# Patient Record
Sex: Female | Born: 1955 | Race: White | Hispanic: No | Marital: Married | Smoking: Never smoker
Health system: Southern US, Community
[De-identification: ages and names within clinical notes are randomized; demographics above are authoritative.]

## PROBLEM LIST (undated history)

## (undated) DIAGNOSIS — G4730 Sleep apnea, unspecified: Secondary | ICD-10-CM

## (undated) DIAGNOSIS — T7840XA Allergy, unspecified, initial encounter: Secondary | ICD-10-CM

## (undated) DIAGNOSIS — Z8719 Personal history of other diseases of the digestive system: Secondary | ICD-10-CM

## (undated) DIAGNOSIS — M8580 Other specified disorders of bone density and structure, unspecified site: Secondary | ICD-10-CM

## (undated) DIAGNOSIS — R569 Unspecified convulsions: Secondary | ICD-10-CM

## (undated) DIAGNOSIS — M1990 Unspecified osteoarthritis, unspecified site: Secondary | ICD-10-CM

## (undated) DIAGNOSIS — R519 Headache, unspecified: Secondary | ICD-10-CM

## (undated) HISTORY — DX: Allergy, unspecified, initial encounter: T78.40XA

## (undated) HISTORY — PX: TONSILLECTOMY AND ADENOIDECTOMY: SUR1326

## (undated) HISTORY — DX: Unspecified convulsions: R56.9

## (undated) HISTORY — PX: BUNIONECTOMY: SHX129

## (undated) HISTORY — PX: DILATION AND CURETTAGE OF UTERUS: SHX78

## (undated) HISTORY — DX: Other specified disorders of bone density and structure, unspecified site: M85.80

## (undated) HISTORY — PX: TUBAL LIGATION: SHX77

## (undated) HISTORY — DX: Sleep apnea, unspecified: G47.30

---

## 1999-01-04 ENCOUNTER — Other Ambulatory Visit: Admission: RE | Admit: 1999-01-04 | Discharge: 1999-01-04 | Payer: Self-pay | Admitting: Gynecology

## 2000-01-30 ENCOUNTER — Other Ambulatory Visit: Admission: RE | Admit: 2000-01-30 | Discharge: 2000-01-30 | Payer: Self-pay | Admitting: Gynecology

## 2001-02-07 ENCOUNTER — Other Ambulatory Visit: Admission: RE | Admit: 2001-02-07 | Discharge: 2001-02-07 | Payer: Self-pay | Admitting: Gynecology

## 2002-03-31 ENCOUNTER — Other Ambulatory Visit: Admission: RE | Admit: 2002-03-31 | Discharge: 2002-03-31 | Payer: Self-pay | Admitting: Gynecology

## 2002-09-16 ENCOUNTER — Other Ambulatory Visit: Admission: RE | Admit: 2002-09-16 | Discharge: 2002-09-16 | Payer: Self-pay | Admitting: Gynecology

## 2003-04-02 ENCOUNTER — Other Ambulatory Visit: Admission: RE | Admit: 2003-04-02 | Discharge: 2003-04-02 | Payer: Self-pay | Admitting: Gynecology

## 2004-02-26 ENCOUNTER — Encounter (INDEPENDENT_AMBULATORY_CARE_PROVIDER_SITE_OTHER): Payer: Self-pay | Admitting: *Deleted

## 2004-02-26 ENCOUNTER — Ambulatory Visit (HOSPITAL_COMMUNITY): Admission: RE | Admit: 2004-02-26 | Discharge: 2004-02-26 | Payer: Self-pay | Admitting: Gynecology

## 2004-05-24 ENCOUNTER — Other Ambulatory Visit: Admission: RE | Admit: 2004-05-24 | Discharge: 2004-05-24 | Payer: Self-pay | Admitting: Gynecology

## 2004-10-07 ENCOUNTER — Ambulatory Visit: Payer: Self-pay | Admitting: Family Medicine

## 2005-01-12 ENCOUNTER — Ambulatory Visit: Payer: Self-pay | Admitting: Family Medicine

## 2005-02-10 ENCOUNTER — Ambulatory Visit: Payer: Self-pay | Admitting: Family Medicine

## 2005-05-25 ENCOUNTER — Other Ambulatory Visit: Admission: RE | Admit: 2005-05-25 | Discharge: 2005-05-25 | Payer: Self-pay | Admitting: Gynecology

## 2005-05-26 ENCOUNTER — Ambulatory Visit: Payer: Self-pay | Admitting: Family Medicine

## 2005-06-16 ENCOUNTER — Ambulatory Visit: Payer: Self-pay | Admitting: Family Medicine

## 2005-07-20 ENCOUNTER — Ambulatory Visit: Payer: Self-pay | Admitting: Family Medicine

## 2005-09-21 ENCOUNTER — Ambulatory Visit: Payer: Self-pay | Admitting: Family Medicine

## 2008-09-09 ENCOUNTER — Ambulatory Visit (HOSPITAL_BASED_OUTPATIENT_CLINIC_OR_DEPARTMENT_OTHER): Admission: RE | Admit: 2008-09-09 | Discharge: 2008-09-09 | Payer: Self-pay | Admitting: Gynecology

## 2008-09-09 ENCOUNTER — Encounter (INDEPENDENT_AMBULATORY_CARE_PROVIDER_SITE_OTHER): Payer: Self-pay | Admitting: Gynecology

## 2010-10-20 LAB — POCT HEMOGLOBIN-HEMACUE: Hemoglobin: 13.8 g/dL (ref 12.0–15.0)

## 2010-11-22 NOTE — Op Note (Signed)
NAMEANJULI, Joyce Allen              ACCOUNT NO.:  [id number]   MEDICAL RECORD NO.:  [id number]          PATIENT TYPE:  AMB   LOCATION:  [facility]                         FACILITY:  [facility]   PHYSICIAN:  Luvenia Redden, M.D.   DATE OF BIRTH:  [birth year]   DATE OF PROCEDURE:  09/09/2008  DATE OF DISCHARGE:                               OPERATIVE REPORT   PREOPERATIVE DIAGNOSES:  1. Postmenopausal bleeding.  2. Cervical stenosis.   POSTOPERATIVE DIAGNOSES:  1. Postmenopausal bleeding.  2. Cervical stenosis.   OPERATION:  Hysteroscopy and dilation and curettage.   SURGEON:  Luvenia Redden, M.D.   PROCEDURE:  Under good anesthesia, the patient prepped and draped in a  sterile manner.  Bimanual exam revealed the uterus to be anterior,  normal sized.  There were no adnexal masses or pelvic masses palpable.  The cervix was grasped with a tenaculum and the cervix was sounded with  the smallest dilator, after some difficulty, the internal os was located  and entered.  Following this, the uterus sounded to a depth of 3 inches.  The cervix was dilated easily.  Then the cervical curettage was done.  Curettage was done and this was sent as a separate specimen.  Hysteroscope was inserted into the cervical canal.  Using sorbitol as  the distending medium, the scope was advanced and the cavity viewed.  There were several small polypoid structures seen and no other  abnormalities.  Scope was retracted.  The endometrial cavity was  explored with polyp forceps and several fragments of polypoid tissue  were removed.  The cavity was then curetted completely and sharply with  a Haney curette.  Again, a small amount of tissue was obtained.  The  endometrial cavity was then wiped with a dry sponge.  No loose fragments  were obtained.  The patient was observed for a few minutes for excessive  bleeding.  There was none.  Fluid deficit was 20 mL.  Blood loss was  minimal.  The patient tolerated the procedure  well.  She was removed to  recovery in good condition.           ______________________________  Luvenia Redden, M.D.     WSB/MEDQ  D:  09/09/2008  T:  09/09/2008  Job:  [id number]

## 2010-11-25 NOTE — Op Note (Signed)
NAME:  Joyce Allen, Joyce Allen                        ACCOUNT NO.:  [id number]   MEDICAL RECORD NO.:  [id number]                   PATIENT TYPE:  AMB   LOCATION:  [facility]                                  FACILITY:  [facility]   PHYSICIAN:  Luvenia Redden, M.D.                DATE OF BIRTH:  11/26/1955   DATE OF PROCEDURE:  02/26/2004  DATE OF DISCHARGE:                                 OPERATIVE REPORT   PREOPERATIVE DIAGNOSES:  Abnormal uterine bleeding.   POSTOPERATIVE DIAGNOSES:  1. Abnormal uterine bleeding.  2. Probable endometrial polyp.   SURGEON:  Luvenia Redden, M.D.   DESCRIPTION OF PROCEDURE:  Under good sedation, the patient was prepped and  draped in a sterile manner. Manual exam revealed the uterus to be mid  position, normal size, movable and no adnexal masses were palpable. The  cervix was grasped with a tenaculum, paracervical block performed by  injecting 10 mL of 1% lidocaine at the 4 and 8 o'clock position  paracervically. The attempts to sound the uterus were unsuccessful due to  cervical stenosis. The smallest uterine dilator was used to gently probe the  canal, canal was found and then was able to be dilated adequately.  An  endocervical curettage was done and this was sent as a separate specimen.  Endometrial curettage was done and a moderate amount of tissue was obtained  including a fairly large fragment which had the appearance of being a polyp.  The endometrial cavity was then wiped with a dry sponge, procedure was  terminated. Blood loss 30 mL, none was replaced.  The patient tolerated the  procedure well and was removed to the recovery room in good condition.                                               Luvenia Redden, M.D.    WSB/MEDQ  D:  02/26/2004  T:  02/27/2004  Job:  [id number]

## 2011-06-26 ENCOUNTER — Other Ambulatory Visit: Payer: Self-pay | Admitting: Gynecology

## 2013-06-30 ENCOUNTER — Other Ambulatory Visit: Payer: Self-pay | Admitting: Gynecology

## 2014-07-07 ENCOUNTER — Other Ambulatory Visit: Payer: Self-pay | Admitting: Obstetrics and Gynecology

## 2014-07-08 LAB — CYTOLOGY - PAP

## 2015-09-27 DIAGNOSIS — Z79899 Other long term (current) drug therapy: Secondary | ICD-10-CM | POA: Insufficient documentation

## 2015-09-27 DIAGNOSIS — G4730 Sleep apnea, unspecified: Secondary | ICD-10-CM | POA: Insufficient documentation

## 2015-09-27 DIAGNOSIS — M8589 Other specified disorders of bone density and structure, multiple sites: Secondary | ICD-10-CM | POA: Insufficient documentation

## 2015-09-27 DIAGNOSIS — F5101 Primary insomnia: Secondary | ICD-10-CM | POA: Insufficient documentation

## 2015-09-27 DIAGNOSIS — G40909 Epilepsy, unspecified, not intractable, without status epilepticus: Secondary | ICD-10-CM | POA: Insufficient documentation

## 2015-09-27 DIAGNOSIS — E782 Mixed hyperlipidemia: Secondary | ICD-10-CM | POA: Insufficient documentation

## 2016-08-11 ENCOUNTER — Other Ambulatory Visit: Payer: Self-pay | Admitting: Obstetrics and Gynecology

## 2016-08-14 LAB — CYTOLOGY - PAP

## 2016-08-16 ENCOUNTER — Other Ambulatory Visit: Payer: Self-pay | Admitting: Obstetrics and Gynecology

## 2016-08-16 DIAGNOSIS — R928 Other abnormal and inconclusive findings on diagnostic imaging of breast: Secondary | ICD-10-CM

## 2016-08-18 ENCOUNTER — Ambulatory Visit
Admission: RE | Admit: 2016-08-18 | Discharge: 2016-08-18 | Disposition: A | Discharge status: Home / Self Care | Payer: Managed Care, Other (non HMO) | Source: Ambulatory Visit | Attending: Obstetrics and Gynecology | Admitting: Obstetrics and Gynecology

## 2016-08-18 DIAGNOSIS — R928 Other abnormal and inconclusive findings on diagnostic imaging of breast: Secondary | ICD-10-CM

## 2016-08-21 ENCOUNTER — Other Ambulatory Visit: Payer: Self-pay

## 2016-10-18 DIAGNOSIS — R5381 Other malaise: Secondary | ICD-10-CM | POA: Insufficient documentation

## 2017-10-01 ENCOUNTER — Encounter: Payer: Self-pay | Admitting: Gastroenterology

## 2017-10-04 ENCOUNTER — Encounter: Payer: Self-pay | Admitting: Gastroenterology

## 2017-10-25 ENCOUNTER — Ambulatory Visit (AMBULATORY_SURGERY_CENTER): Payer: Self-pay

## 2017-10-25 ENCOUNTER — Other Ambulatory Visit: Payer: Self-pay

## 2017-10-25 VITALS — Ht 61.2 in | Wt 142.6 lb

## 2017-10-25 DIAGNOSIS — Z86010 Personal history of colonic polyps: Secondary | ICD-10-CM

## 2017-10-25 MED ORDER — SOD PICOSULFATE-MAG OX-CIT ACD 10-3.5-12 MG-GM -GM/160ML PO SOLN: 1.0000 | Freq: Once | ORAL | 0 refills | Status: AC

## 2017-10-25 NOTE — Progress Notes (Signed)
Denies allergies to eggs or soy products. Denies complication of anesthesia or sedation. Denies use of weight loss medication. Denies use of O2.   Emmi instructions declined.  

## 2017-10-29 ENCOUNTER — Encounter: Payer: Self-pay | Admitting: Gastroenterology

## 2017-11-08 ENCOUNTER — Encounter: Payer: Self-pay | Admitting: Gastroenterology

## 2017-11-08 ENCOUNTER — Ambulatory Visit (AMBULATORY_SURGERY_CENTER): Payer: Managed Care, Other (non HMO) | Admitting: Gastroenterology

## 2017-11-08 ENCOUNTER — Other Ambulatory Visit: Payer: Self-pay

## 2017-11-08 VITALS — BP 106/70 | HR 68 | Temp 98.0°F | Resp 13 | Ht 61.2 in | Wt 142.0 lb

## 2017-11-08 DIAGNOSIS — Z860100 Personal history of colon polyps, unspecified: Secondary | ICD-10-CM

## 2017-11-08 DIAGNOSIS — D124 Benign neoplasm of descending colon: Secondary | ICD-10-CM

## 2017-11-08 MED ORDER — SODIUM CHLORIDE 0.9 % IV SOLN: 500.0000 mL | Freq: Once | INTRAVENOUS | Status: DC

## 2017-11-08 NOTE — Progress Notes (Signed)
A/ox3 pleased with MAC, report to Tracy RN 

## 2017-11-08 NOTE — Progress Notes (Signed)
Called to room to assist during endoscopic procedure.  Patient ID and intended procedure confirmed with present staff. Received instructions for my participation in the procedure from the performing physician.  

## 2017-11-08 NOTE — Patient Instructions (Signed)
Impression/recommnendations:  Polyps (handout given) Diverticulosis (handout given) Hemorrhoids (handout given)  YOU HAD AN ENDOSCOPIC PROCEDURE TODAY AT [place]:   Refer to the procedure report that was given to you for any specific questions about what was found during the examination.  If the procedure report does not answer your questions, please call your gastroenterologist to clarify.  If you requested that your care partner not be given the details of your procedure findings, then the procedure report has been included in a sealed envelope for you to review at your convenience later.  YOU SHOULD EXPECT: Some feelings of bloating in the abdomen. Passage of more gas than usual.  Walking can help get rid of the air that was put into your GI tract during the procedure and reduce the bloating. If you had a lower endoscopy (such as a colonoscopy or flexible sigmoidoscopy) you may notice spotting of blood in your stool or on the toilet paper. If you underwent a bowel prep for your procedure, you may not have a normal bowel movement for a few days.  Please Note:  You might notice some irritation and congestion in your nose or some drainage.  This is from the oxygen used during your procedure.  There is no need for concern and it should clear up in a day or so.  SYMPTOMS TO REPORT IMMEDIATELY:   Following lower endoscopy (colonoscopy or flexible sigmoidoscopy):  Excessive amounts of blood in the stool  Significant tenderness or worsening of abdominal pains  Swelling of the abdomen that is new, acute  Fever of 100F or higher  For urgent or emergent issues, a gastroenterologist can be reached at any hour by calling [phone].   DIET:  We do recommend a small meal at first, but then you may proceed to your regular diet.  Drink plenty of fluids but you should avoid alcoholic beverages for 24 hours.  ACTIVITY:  You should plan to take it easy for the rest of today and  you should NOT DRIVE or use heavy machinery until tomorrow (because of the sedation medicines used during the test).    FOLLOW UP: Our staff will call the number listed on your records the next business day following your procedure to check on you and address any questions or concerns that you may have regarding the information given to you following your procedure. If we do not reach you, we will leave a message.  However, if you are feeling well and you are not experiencing any problems, there is no need to return our call.  We will assume that you have returned to your regular daily activities without incident.  If any biopsies were taken you will be contacted by phone or by letter within the next 1-3 weeks.  Please call us at [phone] if you have not heard about the biopsies in 3 weeks.    SIGNATURES/CONFIDENTIALITY: You and/or your care partner have signed paperwork which will be entered into your electronic medical record.  These signatures attest to the fact that that the information above on your After Visit Summary has been reviewed and is understood.  Full responsibility of the confidentiality of this discharge information lies with you and/or your care-partner.

## 2017-11-08 NOTE — Op Note (Signed)
[facility] Patient Name: Joyce Allen Procedure Date: 11/08/2017 9:43 AM MRN: [id number] Endoscopist: Jackquline Denmark MD, MD Age: 62 Referring MD:  Date of Birth: [birth year] Gender: Female Account #: [id number] Procedure:                Colonoscopy Indications:              High risk colon cancer surveillance: Personal                            history of colonic polyps Medicines:                Monitored Anesthesia Care Procedure:                Pre-Anesthesia Assessment:                           - Prior to the procedure, a History and Physical                            was performed, and patient medications and                            allergies were reviewed. The patient is competent.                            The risks and benefits of the procedure and the                            sedation options and risks were discussed with the                            patient. All questions were answered and informed                            consent was obtained. Patient identification and                            proposed procedure were verified by the physician                            in the procedure room. Mental Status Examination:                            alert and oriented. Prophylactic Antibiotics: The                            patient does not require prophylactic antibiotics.                            Prior Anticoagulants: The patient has taken no                            previous anticoagulant or antiplatelet agents. ASA  Grade Assessment: II - A patient with mild systemic                            disease. After reviewing the risks and benefits,                            the patient was deemed in satisfactory condition to                            undergo the procedure. The anesthesia plan was to                            use monitored anesthesia care (MAC). Immediately                            prior to administration  of medications, the patient                            was re-assessed for adequacy to receive sedatives.                            The heart rate, respiratory rate, oxygen                            saturations, blood pressure, adequacy of pulmonary                            ventilation, and response to care were monitored                            throughout the procedure. The physical status of                            the patient was re-assessed after the procedure.                           After obtaining informed consent, the colonoscope                            was passed under direct vision. Throughout the                            procedure, the patient's blood pressure, pulse, and                            oxygen saturations were monitored continuously. The                            Colonoscope was introduced through the anus and                            advanced to the 2 cm into the ileum. The  colonoscopy was performed without difficulty. The                            patient tolerated the procedure well. The quality                            of the bowel preparation was excellent. Scope In: 9:49:57 AM Scope Out: 10:06:57 AM Scope Withdrawal Time: 0 hours 10 minutes 8 seconds  Total Procedure Duration: 0 hours 17 minutes 0 seconds  Findings:                 A 4 mm polyp was found in the descending colon. The                            polyp was sessile. The polyp was removed with a                            cold biopsy forceps. Resection and retrieval were                            complete.                           A few small-mouthed diverticula were found in the                            sigmoid colon.                           Non-bleeding internal hemorrhoids were found. The                            hemorrhoids were small. Estimated blood loss: none. Complications:            No immediate complications. Estimated Blood Loss:      Estimated blood loss: none. Impression:               - Small descending colon polyp s/p polypectomy                           - Minimal sigmoid diverticulosis.                           - Otherwise normal colonoscopy. Recommendation:           - Patient has a contact number available for                            emergencies. The signs and symptoms of potential                            delayed complications were discussed with the                            patient. Return to normal activities tomorrow.  Written discharge instructions were provided to the                            patient.                           - Resume previous diet.                           - Continue present medications.                           - Await pathology results.                           - Repeat colonoscopy for surveillance based on                            pathology results.                           - Return to GI clinic PRN. Jackquline Denmark MD, MD 11/08/2017 10:12:07 AM This report has been signed electronically.

## 2017-11-08 NOTE — Progress Notes (Signed)
Pt's states no medical or surgical changes since previsit or office visit. 

## 2017-11-09 ENCOUNTER — Telehealth: Payer: Self-pay

## 2017-11-09 NOTE — Telephone Encounter (Signed)
  Follow up Call-  Call back number 11/08/2017  Post procedure Call Back phone  # [phone]  Permission to leave phone message Yes  Some recent data might be hidden     Patient questions:  Do you have a fever, pain , or abdominal swelling? No. Pain Score  0 *  Have you tolerated food without any problems? Yes.    Have you been able to return to your normal activities? Yes.    Do you have any questions about your discharge instructions: Diet   No. Medications  No. Follow up visit  No.  Do you have questions or concerns about your Care? No.  Actions: * If pain score is 4 or above: No action needed, pain <4.

## 2017-11-14 ENCOUNTER — Encounter: Payer: Self-pay | Admitting: Gastroenterology

## 2018-10-08 IMAGING — MG 2D DIGITAL DIAGNOSTIC UNILATERAL LEFT MAMMOGRAM WITH CAD AND ADJ
6 series · 6 of 14 positions shown · non-contrast
Comparison: Previous exam(s).

CLINICAL DATA: Screening recall for possible left breast asymmetry.

EXAM:
2D DIGITAL DIAGNOSTIC UNILATERAL LEFT MAMMOGRAM WITH CAD AND ADJUNCT
TOMO
LEFT BREAST ULTRASOUND

[L MLO synth-2D]
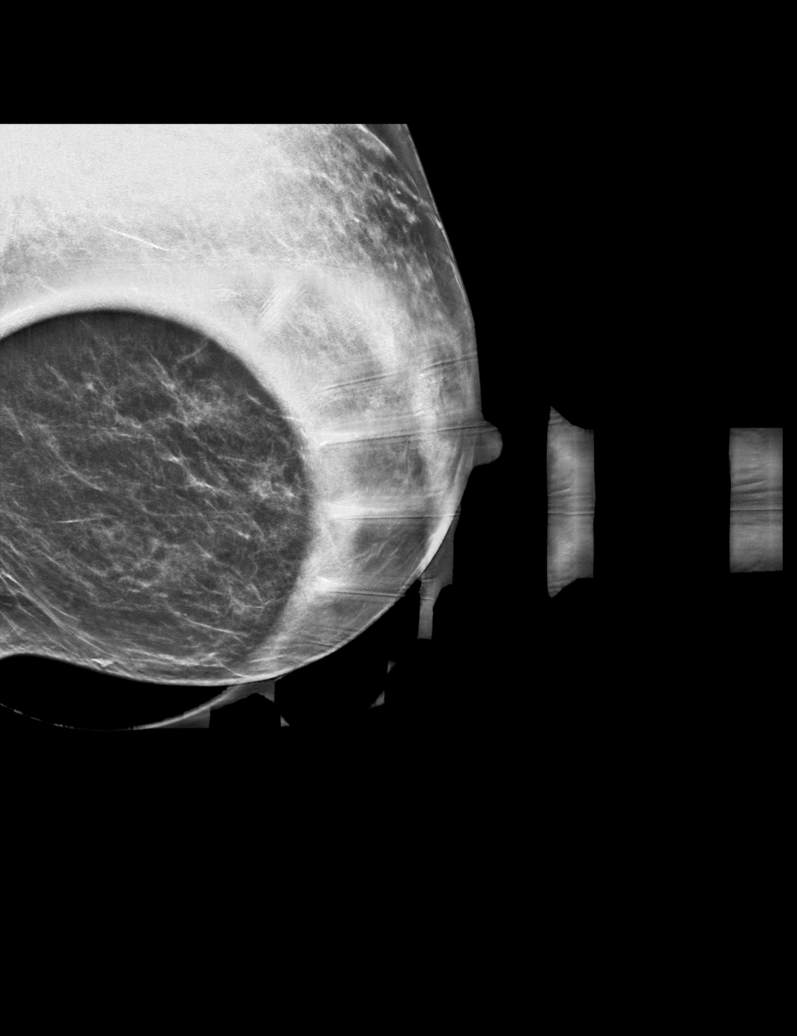

[L ML]
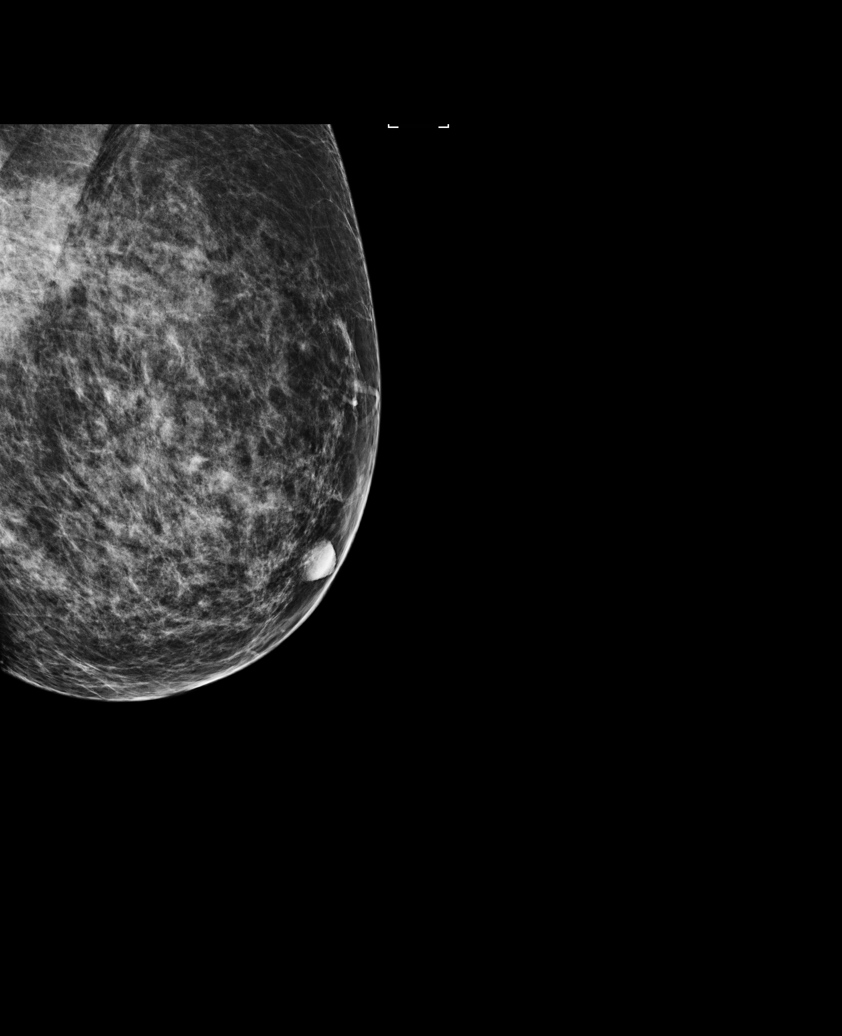

[L MLO]
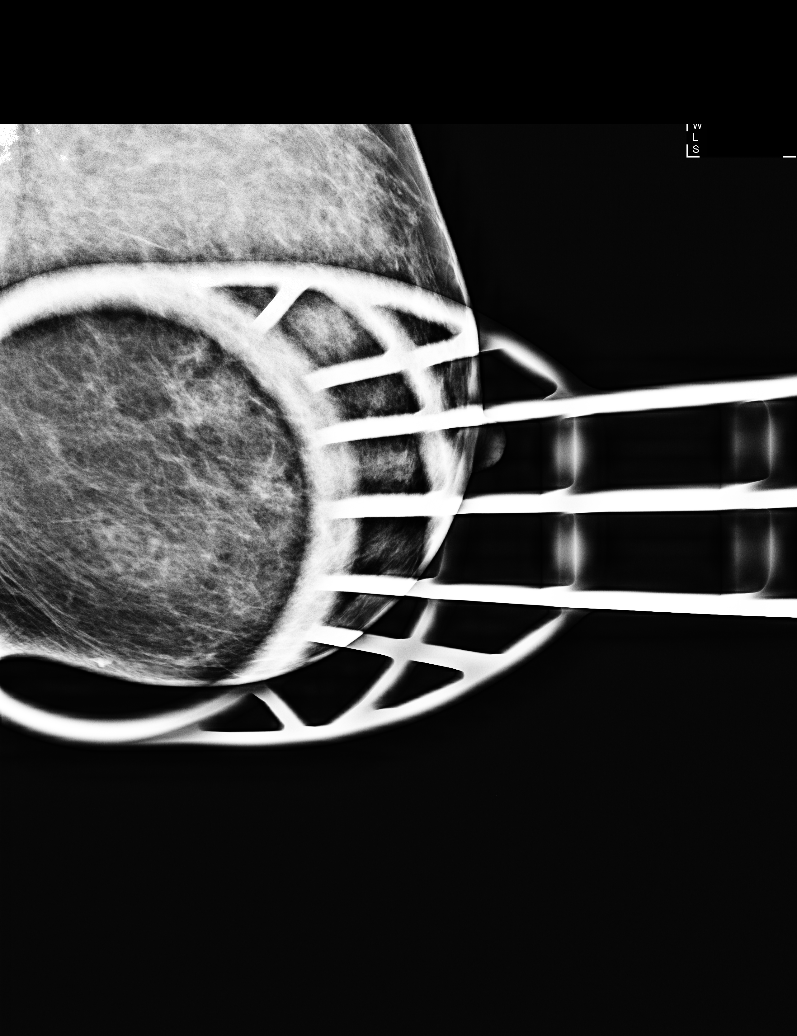

[L ML synth-2D]
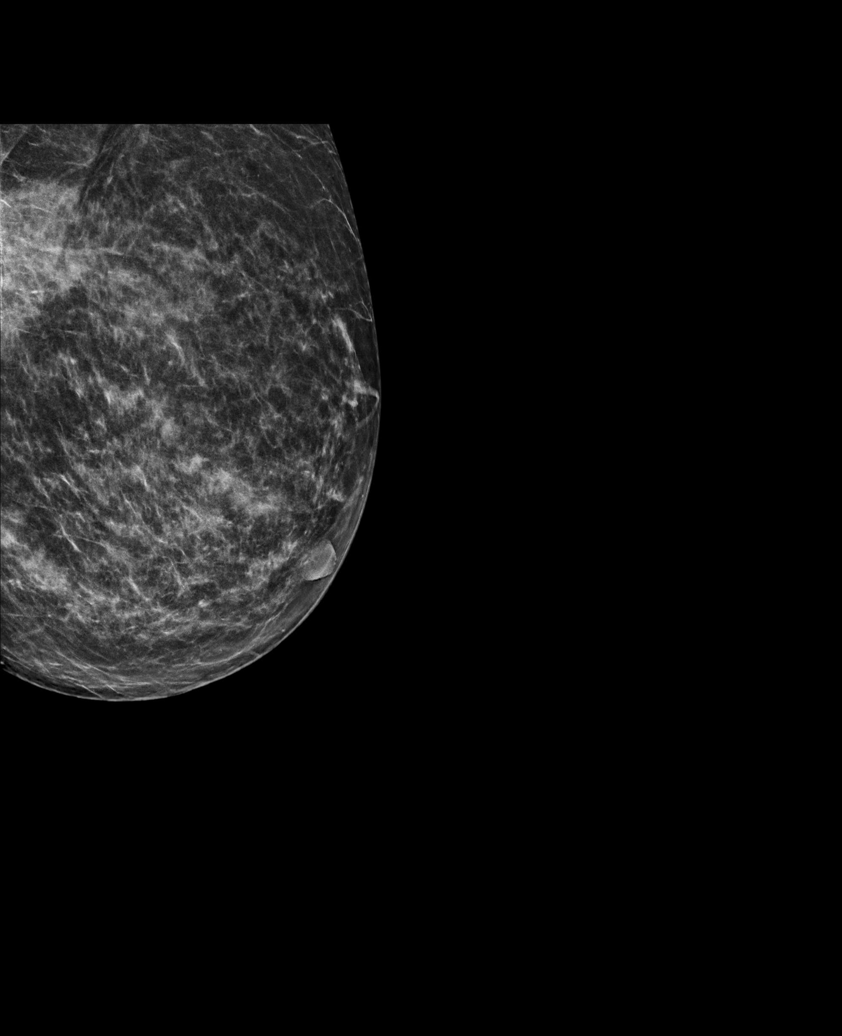

[L ML tomo · tomo slice 33/64.0]
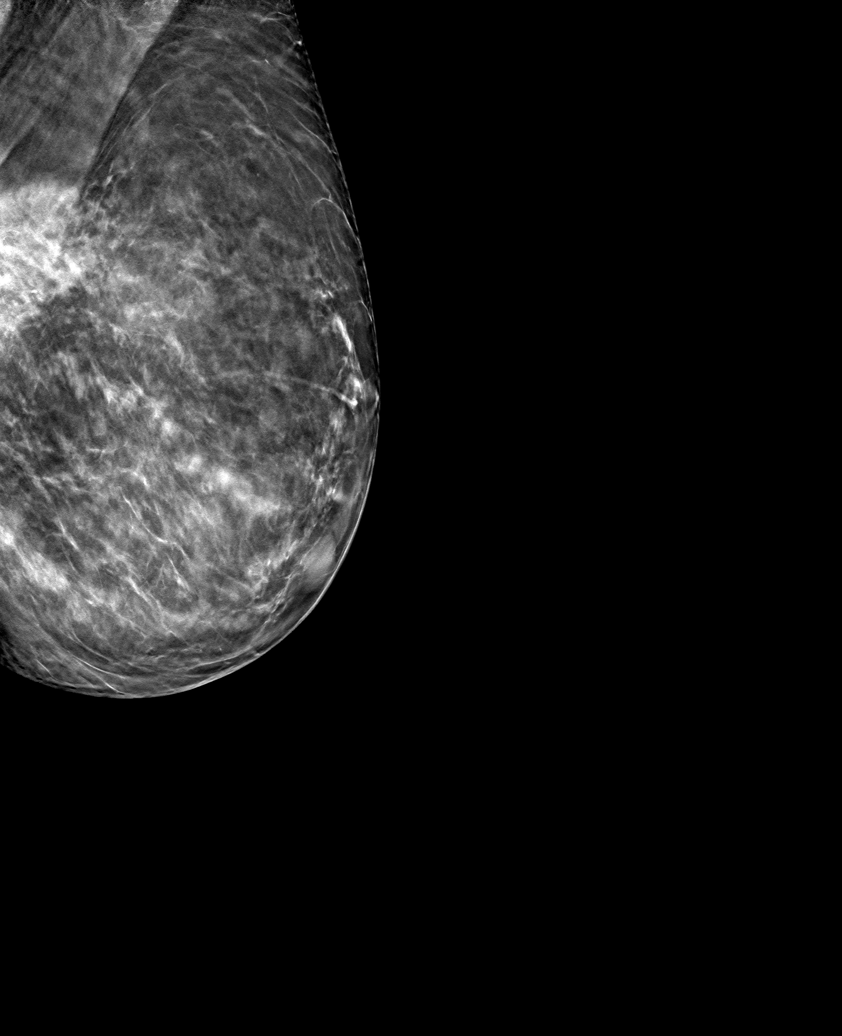

[L MLO tomo · tomo slice 29/56.0]
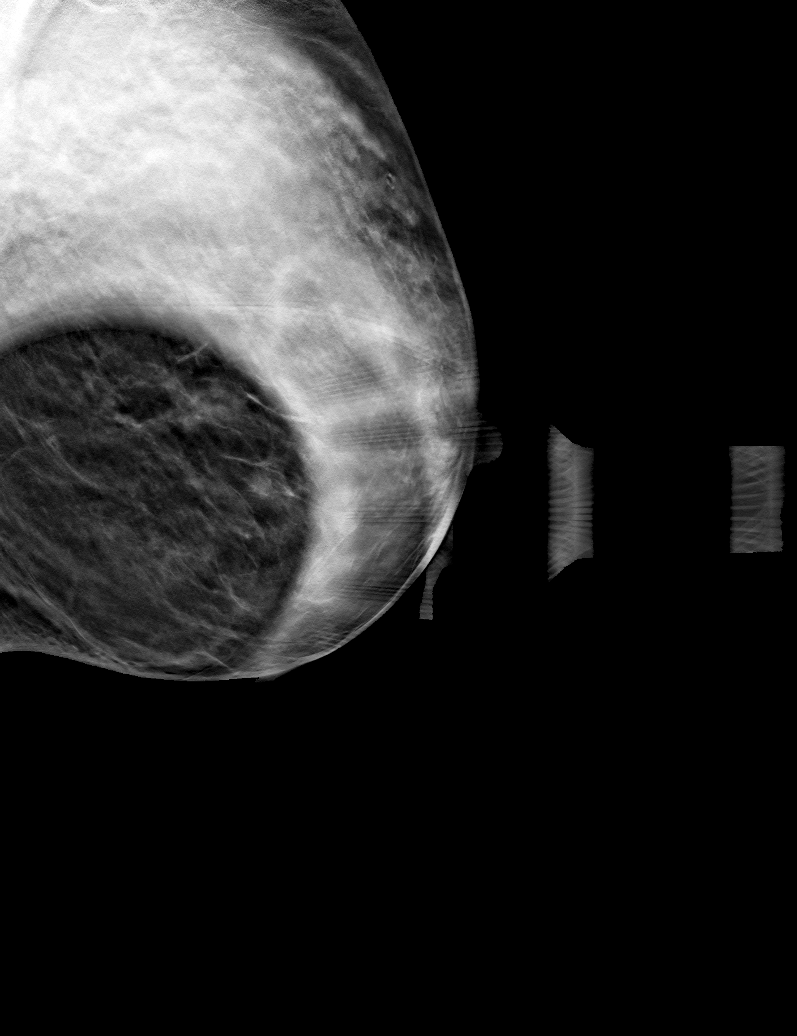

[6 of 14 positions shown; findings below may reference images not displayed]

ACR Breast Density Category c: The breast tissue is heterogeneously
dense, which may obscure small masses.
FINDINGS: Spot compression MLO tomograms and ML tomograms were performed of
the left breast. There is no persistent suspicious mass or area of
distortion seen in the lower left breast. The initially questioned
possible left breast asymmetry was likely related to patient
positioning and overlapping fibroglandular tissue with the
fibroglandular pattern similar in appearance when compared prior
mammograms.

Mammographic images were processed with CAD.

Physical examination of the lower left breast does not reveal any
palpable masses.

Targeted ultrasound of the entire lower left breast was performed.
No suspicious masses or abnormalities are seen, only heterogeneous
fibroglandular tissue is visualized. The entire lower left breast
was scanned.
IMPRESSION: No findings of malignancy in the left breast.

RECOMMENDATION:
Screening mammogram in one year.(Code:CF-H-OIN)

I have discussed the findings and recommendations with the patient.
Results were also provided in writing at the conclusion of the
visit. If applicable, a reminder letter will be sent to the patient
regarding the next appointment.

BI-RADS CATEGORY  1: Negative.

## 2019-03-10 DIAGNOSIS — M1990 Unspecified osteoarthritis, unspecified site: Secondary | ICD-10-CM | POA: Insufficient documentation

## 2019-10-09 DIAGNOSIS — M51369 Other intervertebral disc degeneration, lumbar region without mention of lumbar back pain or lower extremity pain: Secondary | ICD-10-CM | POA: Insufficient documentation

## 2019-10-09 DIAGNOSIS — M5136 Other intervertebral disc degeneration, lumbar region: Secondary | ICD-10-CM | POA: Insufficient documentation

## 2020-09-13 ENCOUNTER — Other Ambulatory Visit: Payer: Self-pay | Admitting: Podiatry

## 2020-09-13 ENCOUNTER — Ambulatory Visit (INDEPENDENT_AMBULATORY_CARE_PROVIDER_SITE_OTHER): Payer: Medicare Other

## 2020-09-13 ENCOUNTER — Ambulatory Visit (INDEPENDENT_AMBULATORY_CARE_PROVIDER_SITE_OTHER): Payer: Medicare Other | Admitting: Podiatry

## 2020-09-13 ENCOUNTER — Other Ambulatory Visit: Payer: Self-pay

## 2020-09-13 DIAGNOSIS — M6740 Ganglion, unspecified site: Secondary | ICD-10-CM

## 2020-09-13 DIAGNOSIS — M67472 Ganglion, left ankle and foot: Secondary | ICD-10-CM

## 2020-09-13 DIAGNOSIS — M79672 Pain in left foot: Secondary | ICD-10-CM

## 2020-09-13 DIAGNOSIS — M67479 Ganglion, unspecified ankle and foot: Secondary | ICD-10-CM | POA: Diagnosis not present

## 2020-09-13 DIAGNOSIS — M25872 Other specified joint disorders, left ankle and foot: Secondary | ICD-10-CM | POA: Diagnosis not present

## 2020-09-13 NOTE — Progress Notes (Signed)
  Subjective:  Patient ID: Joyce Allen, female    DOB: 1956-04-09,  MRN: [id number]  Chief Complaint  Patient presents with  . Mass    Growth at Lt 4th-5th met shaft x 6 mo; 7/10 occasional pain -pain when walking/ pressure is applied noj redness -have got bigger last 2 mo Tx: none     65 y.o. female presents with the above complaint. History confirmed with patient.   Objective:  Physical Exam: warm, good capillary refill, no trophic changes or ulcerative lesions, normal DP and PT pulses and normal sensory exam. Left Foot: left painful fluctuant mass over the 4th metatarsal area. Approx 1cm diameter. Slightly mobile   No images are attached to the encounter.  Radiographs: X-ray of the left foot: no fracture, dislocation, swelling or degenerative changes noted Assessment:   1. Ganglion cyst of foot   2. Left foot pain   3. Mass of joint of left foot    Plan:  Patient was evaluated and treated and all questions answered.  Ganglion cyst left -XR reviewed with patient -Discussed aspiration vs excision. Since this is a recurrence I think aspiration is less likely to completely resolve the problem. Pt states this is starting to affect her daily activities and opts for excision. Did discuss possible recurrence even with excision. Patient verbalized understanding. -Patient has failed all conservative therapy and wishes to proceed with surgical intervention. All risks, benefits, and alternatives discussed with patient. No guarantees given. Consent reviewed and signed by patient. -Planned procedures: Left foot excision of mass (ganglion) -ASA 2 - Patient with mild systemic disease with no functional limitations   -Post-op anticoagulation: chemoprophylaxis not indicated  No follow-ups on file.

## 2020-10-07 ENCOUNTER — Telehealth: Payer: Self-pay

## 2020-10-07 NOTE — Telephone Encounter (Signed)
Noted thanks °

## 2020-10-07 NOTE — Telephone Encounter (Signed)
Rella called to cancel her surgery with Dr. March Rummage on 10/13/2020. She stated she changed her mind on being put to sleep. I notified Dr. March Rummage and Caren Griffins with [facility].

## 2020-10-18 ENCOUNTER — Other Ambulatory Visit: Payer: Self-pay

## 2020-10-18 ENCOUNTER — Ambulatory Visit (INDEPENDENT_AMBULATORY_CARE_PROVIDER_SITE_OTHER): Payer: Medicare Other | Admitting: Podiatry

## 2020-10-18 DIAGNOSIS — M67472 Ganglion, left ankle and foot: Secondary | ICD-10-CM | POA: Diagnosis not present

## 2020-10-18 DIAGNOSIS — M67479 Ganglion, unspecified ankle and foot: Secondary | ICD-10-CM

## 2020-10-18 DIAGNOSIS — M79672 Pain in left foot: Secondary | ICD-10-CM

## 2020-10-18 MED ORDER — DEXAMETHASONE SODIUM PHOSPHATE 120 MG/30ML IJ SOLN
4.0000 mg | Freq: Once | INTRAMUSCULAR | Status: AC
  Administered 2020-10-18: 4 mg via INTRA_ARTICULAR

## 2020-10-18 NOTE — Progress Notes (Signed)
  Subjective:  Patient ID: Joyce Allen, female    DOB: 02/17/1956,  MRN: [id number]  Chief Complaint  Patient presents with  . Ganglion Cyst    F/U Lt cys -pt states cyst remains the same no change/improvement -little redness and occasional pain when walking; 5/10 Tx: none    65 y.o. female presents with the above complaint. History confirmed with patient.   Objective:  Physical Exam: warm, good capillary refill, no trophic changes or ulcerative lesions, normal DP and PT pulses and normal sensory exam. Left Foot: left painful fluctuant mass over the 4th metatarsal area. Approx 1cm diameter. Slightly mobile   Assessment:   1. Ganglion cyst of foot   2. Left foot pain    Plan:  Patient was evaluated and treated and all questions answered.  Ganglion cyst left -Anesthetized with 3cc lidocaine 1% with epi. Locally prepped with betadine. Aspirated lesion with 18g needle without aspirate. Lesion injected with 4mg  dexamethasone. Dressed with band-aid, Coban compression dressing -Should symptoms persist consider excision.  Return in about 3 weeks (around 11/08/2020) for Ganglion f/u.

## 2020-11-01 ENCOUNTER — Encounter: Payer: Medicare Other | Admitting: Podiatry

## 2020-11-08 ENCOUNTER — Other Ambulatory Visit: Payer: Self-pay

## 2020-11-08 ENCOUNTER — Ambulatory Visit (INDEPENDENT_AMBULATORY_CARE_PROVIDER_SITE_OTHER): Payer: Medicare Other | Admitting: Podiatry

## 2020-11-08 DIAGNOSIS — M25872 Other specified joint disorders, left ankle and foot: Secondary | ICD-10-CM

## 2020-11-08 DIAGNOSIS — M67472 Ganglion, left ankle and foot: Secondary | ICD-10-CM

## 2020-11-08 DIAGNOSIS — M67479 Ganglion, unspecified ankle and foot: Secondary | ICD-10-CM

## 2020-11-08 DIAGNOSIS — M79672 Pain in left foot: Secondary | ICD-10-CM

## 2020-11-08 MED ORDER — BETAMETHASONE SOD PHOS & ACET 6 (3-3) MG/ML IJ SUSP
6.0000 mg | Freq: Once | INTRAMUSCULAR | Status: AC
  Administered 2020-11-08: 6 mg

## 2020-11-08 NOTE — Progress Notes (Signed)
  Subjective:  Patient ID: Joyce Allen, female    DOB: 1956-03-08,  MRN: [id number]  Chief Complaint  Patient presents with  . Ganglion Cyst    F/U LT ganglion -pt states,' about the same, no change, back to the same.; 5/10 occasional pain." -pain depends on what shoes I am wearing -little swellign but no redness tx: none     65 y.o. female presents with the above complaint. History confirmed with patient.   Objective:  Physical Exam: warm, good capillary refill, no trophic changes or ulcerative lesions, normal DP and PT pulses and normal sensory exam. Left Foot: left painful fluctuant mass over the 4th metatarsal area. Approx 1cm diameter. Slightly mobile   Assessment:   1. Ganglion cyst of foot   2. Left foot pain   3. Mass of joint of left foot    Plan:  Patient was evaluated and treated and all questions answered.  Ganglion cyst left -Repeat injection of the fluctuant area. -Should issues persist consider excision  Procedure: Ganglion injection Location: dorsal lateral midfoot  Skin Prep: Alcohol. Injectate: 0.5 cc 0.5 cc marcaine plain, 1 betamethasone acetate-betamethasone sodium phosphate Disposition: Patient tolerated procedure well. Injection site dressed with a band-aid.  Return in about 3 weeks (around 11/29/2020) for ganglion f/u.

## 2020-11-29 ENCOUNTER — Ambulatory Visit: Payer: Medicare Other | Admitting: Podiatry

## 2023-02-07 ENCOUNTER — Ambulatory Visit: Payer: Managed Care, Other (non HMO) | Admitting: Nurse Practitioner

## 2023-06-04 ENCOUNTER — Encounter: Payer: Self-pay | Admitting: Gastroenterology

## 2023-10-30 ENCOUNTER — Other Ambulatory Visit: Payer: Self-pay | Admitting: Physical Medicine and Rehabilitation

## 2023-10-30 DIAGNOSIS — M545 Low back pain, unspecified: Secondary | ICD-10-CM

## 2023-10-31 ENCOUNTER — Ambulatory Visit
Admission: RE | Admit: 2023-10-31 | Discharge: 2023-10-31 | Disposition: A | Discharge status: Home / Self Care | Source: Ambulatory Visit | Attending: Physical Medicine and Rehabilitation | Admitting: Physical Medicine and Rehabilitation

## 2023-10-31 DIAGNOSIS — M545 Low back pain, unspecified: Secondary | ICD-10-CM

## 2024-01-29 ENCOUNTER — Other Ambulatory Visit: Payer: Self-pay | Admitting: Orthopedic Surgery

## 2024-02-06 ENCOUNTER — Encounter (HOSPITAL_COMMUNITY): Payer: Self-pay

## 2024-02-06 NOTE — Pre-Procedure Instructions (Signed)
 Surgical Instructions   Your procedure is scheduled on February 13, 2024. Report to [facility] Main Entrance A at 6:30 A.M., then check in with the Admitting office. Any questions or running late day of surgery: call [phone]  Questions prior to your surgery date: call [phone], Monday-Friday, 8am-4pm. If you experience any cold or flu symptoms such as cough, fever, chills, shortness of breath, etc. between now and your scheduled surgery, please notify us  at the above number.     Remember:  Do not eat after midnight the night before your surgery  You may drink clear liquids until 5:30 AM the morning of your surgery.   Clear liquids allowed are: Water, Non-Citrus Juices (without pulp), Carbonated Beverages, Clear Tea (no milk, honey, etc.), Black Coffee Only (NO MILK, CREAM OR POWDERED CREAMER of any kind), and Gatorade.  Patient Instructions  The night before surgery:  No food after midnight. ONLY clear liquids after midnight  The day of surgery (if you do NOT have diabetes):  Drink ONE (1) Pre-Surgery Clear Ensure by 5:30 AM the morning of surgery. Drink in one sitting. Do not sip.  This drink was given to you during your hospital  pre-op appointment visit.  Nothing else to drink after completing the  Pre-Surgery Clear Ensure.         If you have questions, please contact your surgeon's office.    Take these medicines the morning of surgery with A SIP OF WATER: fluticasone (FLONASE) nasal spray    One week prior to surgery, STOP taking any Aspirin (unless otherwise instructed by your surgeon) Aleve, Naproxen, Ibuprofen, Motrin, Advil, Goody's, BC's, all herbal medications, fish oil, and non-prescription vitamins.                     Do NOT Smoke (Tobacco/Vaping) for 24 hours prior to your procedure.  If you use a CPAP at night, you may bring your mask/headgear for your overnight stay.   You will be asked to remove any contacts, glasses, piercing's, hearing aid's,  dentures/partials prior to surgery. Please bring cases for these items if needed.    Patients discharged the day of surgery will not be allowed to drive home, and someone needs to stay with them for 24 hours.  SURGICAL WAITING ROOM VISITATION Patients may have no more than 2 support people in the waiting area - these visitors may rotate.   Pre-op nurse will coordinate an appropriate time for 1 ADULT support person, who may not rotate, to accompany patient in pre-op.  Children under the age of 49 must have an adult with them who is not the patient and must remain in the main waiting area with an adult.  If the patient needs to stay at the hospital during part of their recovery, the visitor guidelines for inpatient rooms apply.  Please refer to the [facility] website for the visitor guidelines for any additional information.   If you received a COVID test during your pre-op visit  it is requested that you wear a mask when out in public, stay away from anyone that may not be feeling well and notify your surgeon if you develop symptoms. If you have been in contact with anyone that has tested positive in the last 10 days please notify you surgeon.      Pre-operative 5 CHG Bathing Instructions   You can play a key role in reducing the risk of infection after surgery. Your skin needs to be as free of  germs as possible. You can reduce the number of germs on your skin by washing with CHG (chlorhexidine gluconate) soap before surgery. CHG is an antiseptic soap that kills germs and continues to kill germs even after washing.   DO NOT use if you have an allergy to chlorhexidine/CHG or antibacterial soaps. If your skin becomes reddened or irritated, stop using the CHG and notify one of our RNs at [phone].   Please shower with the CHG soap starting 4 days before surgery using the following schedule:     Please keep in mind the following:  DO NOT shave, including legs and underarms, starting the  day of your first shower.   You may shave your face at any point before/day of surgery.  Place clean sheets on your bed the day you start using CHG soap. Use a clean washcloth (not used since being washed) for each shower. DO NOT sleep with pets once you start using the CHG.   CHG Shower Instructions:  Wash your face and private area with normal soap. If you choose to wash your hair, wash first with your normal shampoo.  After you use shampoo/soap, rinse your hair and body thoroughly to remove shampoo/soap residue.  Turn the water OFF and apply about 3 tablespoons (45 ml) of CHG soap to a CLEAN washcloth.  Apply CHG soap ONLY FROM YOUR NECK DOWN TO YOUR TOES (washing for 3-5 minutes)  DO NOT use CHG soap on face, private areas, open wounds, or sores.  Pay special attention to the area where your surgery is being performed.  If you are having back surgery, having someone wash your back for you may be helpful. Wait 2 minutes after CHG soap is applied, then you may rinse off the CHG soap.  Pat dry with a clean towel  Put on clean clothes/pajamas   If you choose to wear lotion, please use ONLY the CHG-compatible lotions that are listed below.  Additional instructions for the day of surgery: DO NOT APPLY any lotions, deodorants, cologne, or perfumes.   Do not bring valuables to the hospital. [facility] is not responsible for any belongings/valuables. Do not wear nail polish, gel polish, artificial nails, or any other type of covering on natural nails (fingers and toes) Do not wear jewelry or makeup Put on clean/comfortable clothes.  Please brush your teeth.  Ask your nurse before applying any prescription medications to the skin.     CHG Compatible Lotions   Aveeno Moisturizing lotion  Cetaphil Moisturizing Cream  Cetaphil Moisturizing Lotion  Clairol Herbal Essence Moisturizing Lotion, Dry Skin  Clairol Herbal Essence Moisturizing Lotion, Extra Dry Skin  Clairol Herbal Essence  Moisturizing Lotion, Normal Skin  Curel Age Defying Therapeutic Moisturizing Lotion with Alpha Hydroxy  Curel Extreme Care Body Lotion  Curel Soothing Hands Moisturizing Hand Lotion  Curel Therapeutic Moisturizing Cream, Fragrance-Free  Curel Therapeutic Moisturizing Lotion, Fragrance-Free  Curel Therapeutic Moisturizing Lotion, Original Formula  Eucerin Daily Replenishing Lotion  Eucerin Dry Skin Therapy Plus Alpha Hydroxy Crme  Eucerin Dry Skin Therapy Plus Alpha Hydroxy Lotion  Eucerin Original Crme  Eucerin Original Lotion  Eucerin Plus Crme Eucerin Plus Lotion  Eucerin TriLipid Replenishing Lotion  Keri Anti-Bacterial Hand Lotion  Keri Deep Conditioning Original Lotion Dry Skin Formula Softly Scented  Keri Deep Conditioning Original Lotion, Fragrance Free Sensitive Skin Formula  Keri Lotion Fast Absorbing Fragrance Free Sensitive Skin Formula  Keri Lotion Fast Absorbing Softly Scented Dry Skin Formula  Keri Original Lotion  Keri Skin  Renewal Lotion WellPoint Smooth Lotion  Keri Silky Smooth Sensitive Skin Lotion  Nivea Body Creamy Conditioning Oil  Nivea Body Extra Enriched Teacher, adult education Moisturizing Lotion Nivea Crme  Nivea Skin Firming Lotion  NutraDerm 30 Skin Lotion  NutraDerm Skin Lotion  NutraDerm Therapeutic Skin Cream  NutraDerm Therapeutic Skin Lotion  ProShield Protective Hand Cream  Provon moisturizing lotion  Please read over the following fact sheets that you were given.

## 2024-02-07 ENCOUNTER — Encounter (HOSPITAL_COMMUNITY): Payer: Self-pay

## 2024-02-07 ENCOUNTER — Other Ambulatory Visit: Payer: Self-pay

## 2024-02-07 ENCOUNTER — Encounter (HOSPITAL_COMMUNITY)
Admission: RE | Admit: 2024-02-07 | Discharge: 2024-02-07 | Disposition: A | Discharge status: Home / Self Care | Source: Ambulatory Visit | Attending: Orthopedic Surgery | Admitting: Orthopedic Surgery

## 2024-02-07 VITALS — BP 145/80 | HR 85 | Temp 97.6°F | Resp 18 | Ht 61.0 in | Wt 159.6 lb

## 2024-02-07 DIAGNOSIS — Z01812 Encounter for preprocedural laboratory examination: Secondary | ICD-10-CM | POA: Insufficient documentation

## 2024-02-07 DIAGNOSIS — M8589 Other specified disorders of bone density and structure, multiple sites: Secondary | ICD-10-CM | POA: Diagnosis not present

## 2024-02-07 DIAGNOSIS — Z01818 Encounter for other preprocedural examination: Secondary | ICD-10-CM

## 2024-02-07 HISTORY — DX: Unspecified osteoarthritis, unspecified site: M19.90

## 2024-02-07 HISTORY — DX: Personal history of other diseases of the digestive system: Z87.19

## 2024-02-07 LAB — TYPE AND SCREEN
ABO/RH(D): A NEG
Antibody Screen: NEGATIVE

## 2024-02-07 LAB — SURGICAL PCR SCREEN
MRSA, PCR: POSITIVE — AB
Staphylococcus aureus: POSITIVE — AB

## 2024-02-07 LAB — BASIC METABOLIC PANEL WITH GFR
Anion gap: 8 (ref 5–15)
BUN: 10 mg/dL (ref 8–23)
CO2: 27 mmol/L (ref 22–32)
Calcium: 9.6 mg/dL (ref 8.9–10.3)
Chloride: 103 mmol/L (ref 98–111)
Creatinine, Ser: 0.76 mg/dL (ref 0.44–1.00)
GFR, Estimated: >60 mL/min (ref >60)
Glucose, Bld: 97 mg/dL (ref 70–99)
Potassium: 3.7 mmol/L (ref 3.5–5.1)
Sodium: 138 mmol/L (ref 135–145)

## 2024-02-07 LAB — CBC
HCT: 43.2 % (ref 36.0–46.0)
Hemoglobin: 14.2 g/dL (ref 12.0–15.0)
MCH: 30.2 pg (ref 26.0–34.0)
MCHC: 32.9 g/dL (ref 30.0–36.0)
MCV: 91.9 fL (ref 80.0–100.0)
Platelets: 298 K/uL (ref 150–400)
RBC: 4.7 MIL/uL (ref 3.87–5.11)
RDW: 12.8 % (ref 11.5–15.5)
WBC: 6.4 K/uL (ref 4.0–10.5)
nRBC: 0 % (ref 0.0–0.2)

## 2024-02-07 NOTE — Progress Notes (Signed)
 PCP - Dr. Cathlyn Nash Cardiologist - denies  PPM/ICD - denies   Chest x-ray - 04/27/17 EKG - n/a Stress Test - 10-15 years ago per pt, in , normal per pt ECHO - denies Cardiac Cath - denies  Sleep Study - OSA+ CPAP - denies  DM- denies  Last dose of GLP1 agonist-  n/a   ASA/Blood Thinner Instructions: n/a   ERAS Protcol - clears until 0530 PRE-SURGERY Ensure given  COVID TEST- n/a   Anesthesia review: no  Patient denies shortness of breath, fever, cough and chest pain at PAT appointment   All instructions explained to the patient, with a verbal understanding of the material. Patient agrees to go over the instructions while at home for a better understanding.  The opportunity to ask questions was provided.

## 2024-02-07 NOTE — Progress Notes (Signed)
 Positive PCR result called to Joyce Allen at Dr. Andrey office

## 2024-02-12 NOTE — Anesthesia Preprocedure Evaluation (Signed)
 Anesthesia Evaluation  Patient identified by MRN, date of birth, ID band Patient awake    Reviewed: Allergy & Precautions, NPO status , Patient's Chart, lab work & pertinent test results  Airway Mallampati: III  TM Distance: >3 FB Neck ROM: Full    Dental  (+) Dental Advisory Given   Pulmonary sleep apnea , neg recent URI   Pulmonary exam normal breath sounds clear to auscultation       Cardiovascular negative cardio ROS Normal cardiovascular exam Rhythm:Regular Rate:Normal     Neuro/Psych Seizures -, Well Controlled,     GI/Hepatic Neg liver ROS, hiatal hernia,,,  Endo/Other  negative endocrine ROS    Renal/GU negative Renal ROS     Musculoskeletal  (+) Arthritis ,    Abdominal  (+) + obese  Peds  Hematology negative hematology ROS (+) DOES NOT REFUSE BLOOD PRODUCTS  Anesthesia Other Findings   Reproductive/Obstetrics                              Anesthesia Physical Anesthesia Plan  ASA: 2  Anesthesia Plan: General   Post-op Pain Management: Tylenol  PO (pre-op)*   Induction: Intravenous  PONV Risk Score and Plan: 4 or greater and Ondansetron , Dexamethasone , Treatment may vary due to age or medical condition and Propofol  infusion  Airway Management Planned: Oral ETT  Additional Equipment:   Intra-op Plan:   Post-operative Plan: Extubation in OR  Informed Consent: I have reviewed the patients History and Physical, chart, labs and discussed the procedure including the risks, benefits and alternatives for the proposed anesthesia with the patient or authorized representative who has indicated his/her understanding and acceptance.     Dental advisory given  Plan Discussed with: CRNA  Anesthesia Plan Comments:          Anesthesia Quick Evaluation

## 2024-02-13 ENCOUNTER — Ambulatory Visit (HOSPITAL_COMMUNITY)

## 2024-02-13 ENCOUNTER — Encounter (HOSPITAL_COMMUNITY)
Admission: RE | Disposition: A | Discharge status: Home / Self Care | Payer: Self-pay | Source: Home / Self Care | Attending: Orthopedic Surgery

## 2024-02-13 ENCOUNTER — Ambulatory Visit (HOSPITAL_COMMUNITY): Payer: Self-pay | Admitting: Anesthesiology

## 2024-02-13 ENCOUNTER — Encounter (HOSPITAL_COMMUNITY): Payer: Self-pay | Admitting: Orthopedic Surgery

## 2024-02-13 ENCOUNTER — Other Ambulatory Visit: Payer: Self-pay

## 2024-02-13 ENCOUNTER — Observation Stay (HOSPITAL_COMMUNITY)
Admission: RE | Admit: 2024-02-13 | Discharge: 2024-02-14 | Disposition: A | Discharge status: Home / Self Care | Attending: Orthopedic Surgery | Admitting: Orthopedic Surgery

## 2024-02-13 DIAGNOSIS — M4316 Spondylolisthesis, lumbar region: Secondary | ICD-10-CM | POA: Diagnosis not present

## 2024-02-13 DIAGNOSIS — M5116 Intervertebral disc disorders with radiculopathy, lumbar region: Principal | ICD-10-CM | POA: Insufficient documentation

## 2024-02-13 DIAGNOSIS — Z91040 Latex allergy status: Secondary | ICD-10-CM | POA: Diagnosis not present

## 2024-02-13 DIAGNOSIS — M48061 Spinal stenosis, lumbar region without neurogenic claudication: Secondary | ICD-10-CM

## 2024-02-13 HISTORY — PX: TRANSFORAMINAL LUMBAR INTERBODY FUSION (TLIF) WITH PEDICLE SCREW FIXATION 1 LEVEL: SHX6141

## 2024-02-13 LAB — ABO/RH: ABO/RH(D): A NEG

## 2024-02-13 SURGERY — TRANSFORAMINAL LUMBAR INTERBODY FUSION (TLIF) WITH PEDICLE SCREW FIXATION 1 LEVEL
Anesthesia: General | Laterality: Right

## 2024-02-13 MED ORDER — LIDOCAINE 2% (20 MG/ML) 5 ML SYRINGE
INTRAMUSCULAR | Status: AC
  Filled 2024-02-13: qty 5

## 2024-02-13 MED ORDER — BUPIVACAINE LIPOSOME 1.3 % IJ SUSP
INTRAMUSCULAR | Status: DC | PRN
  Administered 2024-02-13: 20 mL

## 2024-02-13 MED ORDER — FLEET ENEMA RE ENEM: 1.0000 | ENEMA | Freq: Once | RECTAL | Status: DC | PRN

## 2024-02-13 MED ORDER — METHOCARBAMOL 500 MG PO TABS
500.0000 mg | ORAL_TABLET | Freq: Four times a day (QID) | ORAL | Status: DC | PRN
  Administered 2024-02-13: 500 mg via ORAL

## 2024-02-13 MED ORDER — PROPOFOL 1000 MG/100ML IV EMUL
INTRAVENOUS | Status: AC
  Filled 2024-02-13: qty 100

## 2024-02-13 MED ORDER — ONDANSETRON HCL 4 MG PO TABS: 4.0000 mg | ORAL_TABLET | Freq: Four times a day (QID) | ORAL | Status: DC | PRN

## 2024-02-13 MED ORDER — MORPHINE SULFATE (PF) 2 MG/ML IV SOLN: 2.0000 mg | INTRAVENOUS | Status: DC | PRN

## 2024-02-13 MED ORDER — FENTANYL CITRATE (PF) 250 MCG/5ML IJ SOLN
INTRAMUSCULAR | Status: DC | PRN
  Administered 2024-02-13 (×3): 50 ug via INTRAVENOUS
  Administered 2024-02-13: 100 ug via INTRAVENOUS

## 2024-02-13 MED ORDER — BUPIVACAINE-EPINEPHRINE 0.25% -1:200000 IJ SOLN
INTRAMUSCULAR | Status: DC | PRN
  Administered 2024-02-13: 20 mL
  Administered 2024-02-13: 8 mL

## 2024-02-13 MED ORDER — CHLORHEXIDINE GLUCONATE 0.12 % MT SOLN
15.0000 mL | Freq: Once | OROMUCOSAL | Status: AC
  Administered 2024-02-13: 15 mL via OROMUCOSAL
  Filled 2024-02-13: qty 15

## 2024-02-13 MED ORDER — DROPERIDOL 2.5 MG/ML IJ SOLN: 0.6250 mg | Freq: Once | INTRAMUSCULAR | Status: DC | PRN

## 2024-02-13 MED ORDER — MENTHOL 3 MG MT LOZG
1.0000 | LOZENGE | OROMUCOSAL | Status: DC | PRN
  Administered 2024-02-13: 3 mg via ORAL
  Filled 2024-02-13: qty 9

## 2024-02-13 MED ORDER — METHOCARBAMOL 1000 MG/10ML IJ SOLN: 500.0000 mg | Freq: Four times a day (QID) | INTRAMUSCULAR | Status: DC | PRN

## 2024-02-13 MED ORDER — BISACODYL 5 MG PO TBEC: 5.0000 mg | DELAYED_RELEASE_TABLET | Freq: Every day | ORAL | Status: DC | PRN

## 2024-02-13 MED ORDER — PHENYLEPHRINE HCL-NACL 20-0.9 MG/250ML-% IV SOLN
INTRAVENOUS | Status: DC | PRN
  Administered 2024-02-13: 40 ug/min via INTRAVENOUS

## 2024-02-13 MED ORDER — ACETAMINOPHEN 650 MG RE SUPP: 650.0000 mg | RECTAL | Status: DC | PRN

## 2024-02-13 MED ORDER — CHLORHEXIDINE GLUCONATE CLOTH 2 % EX PADS
6.0000 | MEDICATED_PAD | Freq: Every day | CUTANEOUS | Status: DC
  Administered 2024-02-13: 6 via TOPICAL

## 2024-02-13 MED ORDER — SODIUM CHLORIDE 0.9% FLUSH
3.0000 mL | Freq: Two times a day (BID) | INTRAVENOUS | Status: DC
  Administered 2024-02-13 – 2024-02-14 (×2): 3 mL via INTRAVENOUS

## 2024-02-13 MED ORDER — FLUTICASONE PROPIONATE 50 MCG/ACT NA SUSP
1.0000 | Freq: Every day | NASAL | Status: DC
  Filled 2024-02-13: qty 16

## 2024-02-13 MED ORDER — PHENYLEPHRINE 80 MCG/ML (10ML) SYRINGE FOR IV PUSH (FOR BLOOD PRESSURE SUPPORT)
PREFILLED_SYRINGE | INTRAVENOUS | Status: DC | PRN
  Administered 2024-02-13 (×2): 80 ug via INTRAVENOUS

## 2024-02-13 MED ORDER — BUPIVACAINE-EPINEPHRINE (PF) 0.25% -1:200000 IJ SOLN
INTRAMUSCULAR | Status: AC
Start: 2024-02-13 — End: 2024-02-13
  Filled 2024-02-13: qty 30

## 2024-02-13 MED ORDER — ALBUMIN HUMAN 5 % IV SOLN: INTRAVENOUS | Status: DC | PRN

## 2024-02-13 MED ORDER — CEFAZOLIN SODIUM-DEXTROSE 2-4 GM/100ML-% IV SOLN
2.0000 g | Freq: Three times a day (TID) | INTRAVENOUS | Status: AC
  Administered 2024-02-13 (×2): 2 g via INTRAVENOUS
  Filled 2024-02-13 (×2): qty 100

## 2024-02-13 MED ORDER — POTASSIUM CHLORIDE IN NACL 20-0.9 MEQ/L-% IV SOLN: INTRAVENOUS | Status: DC

## 2024-02-13 MED ORDER — EPHEDRINE 5 MG/ML INJ
INTRAVENOUS | Status: AC
  Filled 2024-02-13: qty 5

## 2024-02-13 MED ORDER — ZOLPIDEM TARTRATE 5 MG PO TABS: 5.0000 mg | ORAL_TABLET | Freq: Every evening | ORAL | Status: DC | PRN

## 2024-02-13 MED ORDER — BUPIVACAINE LIPOSOME 1.3 % IJ SUSP
INTRAMUSCULAR | Status: AC
Start: 2024-02-13 — End: 2024-02-13
  Filled 2024-02-13: qty 20

## 2024-02-13 MED ORDER — ROCURONIUM BROMIDE 10 MG/ML (PF) SYRINGE
PREFILLED_SYRINGE | INTRAVENOUS | Status: AC
Start: 2024-02-13 — End: 2024-02-13
  Filled 2024-02-13: qty 10

## 2024-02-13 MED ORDER — OXYCODONE-ACETAMINOPHEN 5-325 MG PO TABS: 1.0000 | ORAL_TABLET | ORAL | Status: DC | PRN

## 2024-02-13 MED ORDER — ONDANSETRON HCL 4 MG/2ML IJ SOLN
INTRAMUSCULAR | Status: AC
Start: 2024-02-13 — End: 2024-02-13
  Filled 2024-02-13: qty 2

## 2024-02-13 MED ORDER — VANCOMYCIN HCL 1000 MG IV SOLR
INTRAVENOUS | Status: DC | PRN
  Administered 2024-02-13: 1000 mg via INTRAVENOUS

## 2024-02-13 MED ORDER — LIDOCAINE 2% (20 MG/ML) 5 ML SYRINGE
INTRAMUSCULAR | Status: DC | PRN
  Administered 2024-02-13: 70 mg via INTRAVENOUS

## 2024-02-13 MED ORDER — PROPOFOL 10 MG/ML IV BOLUS
INTRAVENOUS | Status: AC
  Filled 2024-02-13: qty 20

## 2024-02-13 MED ORDER — PROPOFOL 10 MG/ML IV BOLUS
INTRAVENOUS | Status: DC | PRN
  Administered 2024-02-13: 100 ug/kg/min via INTRAVENOUS
  Administered 2024-02-13: 40 mg via INTRAVENOUS
  Administered 2024-02-13: 30 mg via INTRAVENOUS
  Administered 2024-02-13: 110 mg via INTRAVENOUS
  Administered 2024-02-13: 150 ug/kg/min via INTRAVENOUS

## 2024-02-13 MED ORDER — SUGAMMADEX SODIUM 200 MG/2ML IV SOLN
INTRAVENOUS | Status: DC | PRN
  Administered 2024-02-13: 200 mg via INTRAVENOUS

## 2024-02-13 MED ORDER — LORATADINE 10 MG PO TABS: 10.0000 mg | ORAL_TABLET | Freq: Every evening | ORAL | Status: DC

## 2024-02-13 MED ORDER — PHENOBARBITAL 32.4 MG PO TABS
97.2000 mg | ORAL_TABLET | Freq: Every day | ORAL | Status: DC
  Administered 2024-02-13: 97.2 mg via ORAL
  Filled 2024-02-13: qty 3

## 2024-02-13 MED ORDER — OXYCODONE HCL 5 MG PO TABS: 5.0000 mg | ORAL_TABLET | Freq: Once | ORAL | Status: DC

## 2024-02-13 MED ORDER — CEFAZOLIN SODIUM-DEXTROSE 2-4 GM/100ML-% IV SOLN
2.0000 g | INTRAVENOUS | Status: DC
  Filled 2024-02-13: qty 100

## 2024-02-13 MED ORDER — SODIUM CHLORIDE 0.9% FLUSH
3.0000 mL | INTRAVENOUS | Status: DC | PRN
Start: 2024-02-13 — End: 2024-02-14

## 2024-02-13 MED ORDER — DEXAMETHASONE SODIUM PHOSPHATE 10 MG/ML IJ SOLN
INTRAMUSCULAR | Status: AC
  Filled 2024-02-13: qty 1

## 2024-02-13 MED ORDER — METHOCARBAMOL 500 MG PO TABS
ORAL_TABLET | ORAL | Status: AC
Start: 2024-02-13 — End: 2024-02-13
  Filled 2024-02-13: qty 1

## 2024-02-13 MED ORDER — ROCURONIUM BROMIDE 10 MG/ML (PF) SYRINGE
PREFILLED_SYRINGE | INTRAVENOUS | Status: AC
  Filled 2024-02-13: qty 10

## 2024-02-13 MED ORDER — DOCUSATE SODIUM 100 MG PO CAPS
100.0000 mg | ORAL_CAPSULE | Freq: Two times a day (BID) | ORAL | Status: DC
  Administered 2024-02-13 – 2024-02-14 (×2): 100 mg via ORAL
  Filled 2024-02-13 (×2): qty 1

## 2024-02-13 MED ORDER — THROMBIN 20000 UNITS EX KIT
PACK | CUTANEOUS | Status: AC
  Filled 2024-02-13: qty 1

## 2024-02-13 MED ORDER — POVIDONE-IODINE 7.5 % EX SOLN
Freq: Once | CUTANEOUS | Status: DC
  Filled 2024-02-13: qty 118

## 2024-02-13 MED ORDER — ONDANSETRON HCL 4 MG/2ML IJ SOLN: 4.0000 mg | Freq: Four times a day (QID) | INTRAMUSCULAR | Status: DC | PRN

## 2024-02-13 MED ORDER — SENNOSIDES-DOCUSATE SODIUM 8.6-50 MG PO TABS: 1.0000 | ORAL_TABLET | Freq: Every evening | ORAL | Status: DC | PRN

## 2024-02-13 MED ORDER — MIDAZOLAM HCL 2 MG/2ML IJ SOLN
INTRAMUSCULAR | Status: AC
Start: 2024-02-13 — End: 2024-02-13
  Filled 2024-02-13: qty 2

## 2024-02-13 MED ORDER — LEVOCETIRIZINE DIHYDROCHLORIDE 5 MG PO TABS: 5.0000 mg | ORAL_TABLET | Freq: Every evening | ORAL | Status: DC

## 2024-02-13 MED ORDER — OXYCODONE HCL 5 MG PO TABS
ORAL_TABLET | ORAL | Status: AC
  Filled 2024-02-13: qty 1

## 2024-02-13 MED ORDER — PHENYLEPHRINE 80 MCG/ML (10ML) SYRINGE FOR IV PUSH (FOR BLOOD PRESSURE SUPPORT)
PREFILLED_SYRINGE | INTRAVENOUS | Status: AC
Start: 2024-02-13 — End: 2024-02-13
  Filled 2024-02-13: qty 10

## 2024-02-13 MED ORDER — ALUM & MAG HYDROXIDE-SIMETH 200-200-20 MG/5ML PO SUSP
30.0000 mL | Freq: Four times a day (QID) | ORAL | Status: DC | PRN
Start: 2024-02-13 — End: 2024-02-14

## 2024-02-13 MED ORDER — ACETAMINOPHEN 325 MG PO TABS
650.0000 mg | ORAL_TABLET | ORAL | Status: DC | PRN
Start: 2024-02-13 — End: 2024-02-14
  Administered 2024-02-13 – 2024-02-14 (×2): 650 mg via ORAL
  Filled 2024-02-13 (×2): qty 2

## 2024-02-13 MED ORDER — VANCOMYCIN HCL IN DEXTROSE 1-5 GM/200ML-% IV SOLN
INTRAVENOUS | Status: AC
  Filled 2024-02-13: qty 200

## 2024-02-13 MED ORDER — 0.9 % SODIUM CHLORIDE (POUR BTL) OPTIME
TOPICAL | Status: DC | PRN
  Administered 2024-02-13: 1000 mL

## 2024-02-13 MED ORDER — SODIUM CHLORIDE 0.9 % IV SOLN: 250.0000 mL | INTRAVENOUS | Status: DC

## 2024-02-13 MED ORDER — LACTATED RINGERS IV SOLN: INTRAVENOUS | Status: DC

## 2024-02-13 MED ORDER — HYDROMORPHONE HCL 1 MG/ML IJ SOLN
INTRAMUSCULAR | Status: AC
  Filled 2024-02-13: qty 1

## 2024-02-13 MED ORDER — MIDAZOLAM HCL 2 MG/2ML IJ SOLN
INTRAMUSCULAR | Status: DC | PRN
  Administered 2024-02-13 (×2): 1 mg via INTRAVENOUS

## 2024-02-13 MED ORDER — THROMBIN 20000 UNITS EX KIT
PACK | CUTANEOUS | Status: DC | PRN
  Administered 2024-02-13: 20 mL via TOPICAL

## 2024-02-13 MED ORDER — HYDROMORPHONE HCL 1 MG/ML IJ SOLN
0.2500 mg | INTRAMUSCULAR | Status: DC | PRN
  Administered 2024-02-13: 0.25 mg via INTRAVENOUS

## 2024-02-13 MED ORDER — DEXAMETHASONE SODIUM PHOSPHATE 10 MG/ML IJ SOLN
INTRAMUSCULAR | Status: DC | PRN
  Administered 2024-02-13: 10 mg via INTRAVENOUS

## 2024-02-13 MED ORDER — ROCURONIUM BROMIDE 10 MG/ML (PF) SYRINGE
PREFILLED_SYRINGE | INTRAVENOUS | Status: DC | PRN
  Administered 2024-02-13 (×2): 20 mg via INTRAVENOUS
  Administered 2024-02-13: 40 mg via INTRAVENOUS
  Administered 2024-02-13: 60 mg via INTRAVENOUS
  Administered 2024-02-13 (×3): 20 mg via INTRAVENOUS

## 2024-02-13 MED ORDER — EPHEDRINE SULFATE-NACL 50-0.9 MG/10ML-% IV SOSY
PREFILLED_SYRINGE | INTRAVENOUS | Status: DC | PRN
  Administered 2024-02-13 (×2): 5 mg via INTRAVENOUS

## 2024-02-13 MED ORDER — PHENOL 1.4 % MT LIQD: 1.0000 | OROMUCOSAL | Status: DC | PRN

## 2024-02-13 MED ORDER — MUPIROCIN 2 % EX OINT
1.0000 | TOPICAL_OINTMENT | Freq: Two times a day (BID) | CUTANEOUS | Status: DC
  Administered 2024-02-13 – 2024-02-14 (×2): 1 via NASAL
  Filled 2024-02-13: qty 22

## 2024-02-13 MED ORDER — ORAL CARE MOUTH RINSE: 15.0000 mL | Freq: Once | OROMUCOSAL | Status: AC

## 2024-02-13 MED ORDER — FENTANYL CITRATE (PF) 250 MCG/5ML IJ SOLN
INTRAMUSCULAR | Status: AC
  Filled 2024-02-13: qty 5

## 2024-02-13 MED ORDER — ACETAMINOPHEN 500 MG PO TABS
1000.0000 mg | ORAL_TABLET | Freq: Once | ORAL | Status: AC
  Administered 2024-02-13: 1000 mg via ORAL
  Filled 2024-02-13: qty 2

## 2024-02-13 MED ORDER — HYDROCODONE-ACETAMINOPHEN 5-325 MG PO TABS: 1.0000 | ORAL_TABLET | ORAL | Status: DC | PRN

## 2024-02-13 MED ORDER — ONDANSETRON HCL 4 MG/2ML IJ SOLN
INTRAMUSCULAR | Status: DC | PRN
  Administered 2024-02-13: 4 mg via INTRAVENOUS

## 2024-02-13 SURGICAL SUPPLY — 80 items
BAG COUNTER SPONGE SURGICOUNT (BAG) ×1 IMPLANT
BENZOIN TINCTURE PRP APPL 2/3 (GAUZE/BANDAGES/DRESSINGS) ×1 IMPLANT
BLADE CLIPPER SURG (BLADE) IMPLANT
BUR PRECISION FLUTE 5.0 (BURR) ×1 IMPLANT
BUR PRESCISION 1.7 ELITE (BURR) ×1 IMPLANT
BUR ROUND FLUTED 5 RND (BURR) ×1 IMPLANT
BUR ROUND PRECISION 4.0 (BURR) IMPLANT
BUR SABER RD CUTTING 3.0 (BURR) IMPLANT
CAGE SABLE 10X22 6-12 0D (Cage) IMPLANT
CNTNR URN SCR LID CUP LEK RST (MISCELLANEOUS) ×1 IMPLANT
COVER BACK TABLE 60X90IN (DRAPES) ×1 IMPLANT
COVER MAYO STAND STRL (DRAPES) ×2 IMPLANT
COVER SURGICAL LIGHT HANDLE (MISCELLANEOUS) ×1 IMPLANT
DISPENSER GRAFT BNE VG (MISCELLANEOUS) IMPLANT
DRAPE C-ARM 42X72 X-RAY (DRAPES) ×1 IMPLANT
DRAPE C-ARMOR (DRAPES) IMPLANT
DRAPE POUCH INSTRU U-SHP 10X18 (DRAPES) ×1 IMPLANT
DRAPE SURG 17X23 STRL (DRAPES) ×4 IMPLANT
DURAPREP 26ML APPLICATOR (WOUND CARE) ×1 IMPLANT
ELECT CAUTERY BLADE 6.4 (BLADE) ×1 IMPLANT
ELECTRODE BLDE 4.0 EZ CLN MEGD (MISCELLANEOUS) ×1 IMPLANT
ELECTRODE REM PT RTRN 9FT ADLT (ELECTROSURGICAL) ×1 IMPLANT
EVACUATOR SILICONE 100CC (DRAIN) IMPLANT
FILTER STRAW FLUID ASPIR (MISCELLANEOUS) ×1 IMPLANT
GAUZE 4X4 16PLY ~~LOC~~+RFID DBL (SPONGE) ×1 IMPLANT
GAUZE SPONGE 4X4 12PLY STRL (GAUZE/BANDAGES/DRESSINGS) ×1 IMPLANT
GLOVE BIO SURGEON STRL SZ 6.5 (GLOVE) ×1 IMPLANT
GLOVE BIO SURGEON STRL SZ8 (GLOVE) ×1 IMPLANT
GLOVE BIOGEL PI IND STRL 7.0 (GLOVE) ×1 IMPLANT
GLOVE BIOGEL PI IND STRL 8 (GLOVE) ×1 IMPLANT
GLOVE SURG ENC MOIS LTX SZ6.5 (GLOVE) ×1 IMPLANT
GOWN STRL REUS W/ TWL LRG LVL3 (GOWN DISPOSABLE) ×2 IMPLANT
GOWN STRL REUS W/ TWL XL LVL3 (GOWN DISPOSABLE) ×1 IMPLANT
GRAFT TISS 40X70 3 THK DERM (Tissue) IMPLANT
IV CATH 14GX2 1/4 (CATHETERS) ×1 IMPLANT
KIT BASIN OR (CUSTOM PROCEDURE TRAY) ×1 IMPLANT
KIT POSITIONER JACKSON TABLE (MISCELLANEOUS) ×1 IMPLANT
KIT TURNOVER KIT B (KITS) ×1 IMPLANT
MARKER SKIN DUAL TIP RULER LAB (MISCELLANEOUS) ×2 IMPLANT
NDL 18GX1X1/2 (RX/OR ONLY) (NEEDLE) ×1 IMPLANT
NDL 22X1.5 STRL (OR ONLY) (MISCELLANEOUS) ×2 IMPLANT
NDL HYPO 25GX1X1/2 BEV (NEEDLE) ×1 IMPLANT
NDL SPNL 18GX3.5 QUINCKE PK (NEEDLE) ×2 IMPLANT
NEEDLE 18GX1X1/2 (RX/OR ONLY) (NEEDLE) ×1 IMPLANT
NEEDLE 22X1.5 STRL (OR ONLY) (MISCELLANEOUS) ×2 IMPLANT
NEEDLE HYPO 25GX1X1/2 BEV (NEEDLE) ×1 IMPLANT
NEEDLE SPNL 18GX3.5 QUINCKE PK (NEEDLE) ×2 IMPLANT
NS IRRIG 1000ML POUR BTL (IV SOLUTION) ×1 IMPLANT
PACK LAMINECTOMY ORTHO (CUSTOM PROCEDURE TRAY) ×1 IMPLANT
PACK UNIVERSAL I (CUSTOM PROCEDURE TRAY) ×1 IMPLANT
PAD ARMBOARD POSITIONER FOAM (MISCELLANEOUS) ×2 IMPLANT
PATTIES SURGICAL .5 X.5 (GAUZE/BANDAGES/DRESSINGS) ×1 IMPLANT
PATTIES SURGICAL .5 X1 (DISPOSABLE) IMPLANT
PATTIES SURGICAL .5X1.5 (GAUZE/BANDAGES/DRESSINGS) IMPLANT
PUTTY DBX 2.5CC DEPUY (Putty) IMPLANT
ROD TI TRIALTIS 40 (Rod) IMPLANT
ROD TI TRIALTIS 5.5X35 (Rod) IMPLANT
SCREW PA FENS 7X30 STL (Screw) IMPLANT
SCREW SET TRIALTIS (Screw) IMPLANT
SCREW TRIALTIS 6X30 STL (Screw) IMPLANT
SCREW TRIALTIS 6X35 (Screw) IMPLANT
SPONGE INTESTINAL PEANUT (DISPOSABLE) ×1 IMPLANT
SPONGE SURGIFOAM ABS GEL 100 (HEMOSTASIS) ×1 IMPLANT
STRIP CLOSURE SKIN 1/2X4 (GAUZE/BANDAGES/DRESSINGS) ×2 IMPLANT
SURGIFLO W/THROMBIN 8M KIT (HEMOSTASIS) IMPLANT
SUT MNCRL AB 4-0 PS2 18 (SUTURE) ×1 IMPLANT
SUT VIC AB 0 CT1 18XCR BRD 8 (SUTURE) ×1 IMPLANT
SUT VIC AB 1 CT1 18XCR BRD 8 (SUTURE) ×1 IMPLANT
SUT VIC AB 2-0 CT2 18 VCP726D (SUTURE) ×1 IMPLANT
SYR 20ML LL LF (SYRINGE) ×2 IMPLANT
SYR BULB IRRIG 60ML STRL (SYRINGE) ×1 IMPLANT
SYR CONTROL 10ML LL (SYRINGE) ×2 IMPLANT
SYR TB 1ML LUER SLIP (SYRINGE) ×1 IMPLANT
TAP SURG TRIALTIS 5 LNG (TAP) IMPLANT
TAP SURG TRIALTIS 6 LNG (TAP) IMPLANT
TAPE CLOTH SURG 4X10 WHT LF (GAUZE/BANDAGES/DRESSINGS) IMPLANT
TRAY FOLEY MTR SLVR 16FR STAT (SET/KITS/TRAYS/PACK) ×1 IMPLANT
TUBE FUNNEL GL DISP (ORTHOPEDIC DISPOSABLE SUPPLIES) IMPLANT
WATER STERILE IRR 1000ML POUR (IV SOLUTION) ×1 IMPLANT
YANKAUER SUCT BULB TIP NO VENT (SUCTIONS) ×1 IMPLANT

## 2024-02-13 NOTE — Op Note (Signed)
 PATIENT NAME: Joyce Allen [facility]   MEDICAL RECORD NO.:   [id number]   DATE OF BIRTH: 08/15/1955   DATE OF PROCEDURE: 02/13/2024                               OPERATIVE REPORT     PREOPERATIVE DIAGNOSES: 1. Right-sided lumbar radiculopathy 2. L4-5 spinal stenosis 3. L4/5 spondylolisthesis   POSTOPERATIVE DIAGNOSES: 1. Right-sided lumbar radiculopathy 2. L4-5 spinal stenosis 3. L4/5 spondylolisthesis   PROCEDURES: 1. L4/5 decompression 2. Right-sided L4-5 transforaminal lumbar interbody fusion. 3. Left-sided L4-5 posterolateral fusion. 4. Insertion of interbody device x1 (Globus expandable intervertebral spacer). 5. Placement of segmental posterior instrumentation L4, L5 bilaterally  6. Use of local autograft. 7. Use of morselized allograft - Vivigen 8. Intraoperative use of fluoroscopy.   SURGEON:  Oneil Priestly, MD.   ASSISTANTBETHA Ileana Clara, PA-C.   ANESTHESIA:  General endotracheal anesthesia.   COMPLICATIONS:  None.   DISPOSITION:  Stable.   ESTIMATED BLOOD LOSS:  100cc   INDICATIONS FOR SURGERY:  Briefly,  Ms. Eno is a pleasant 67 -year-old female who did present to me with severe and ongoing pain in the right  leg. I did feel that the symptoms were secondary to the findings noted above.   The patient failed conservative care and did wish to proceed with the procedure  noted above.   OPERATIVE DETAILS:  On 02/13/2024, the patient was brought to surgery and general endotracheal anesthesia was administered.  The patient was placed prone on a well-padded flat Jackson bed with a spinal frame.  Antibiotics were given and a time-out procedure was performed. The back was prepped and draped in the usual fashion.  A midline incision was made overlying the L4-5 intervertebral spaces.  The fascia was incised at the midline.  The paraspinal musculature was bluntly swept laterally.  Anatomic landmarks for the pedicles were exposed. Using fluoroscopy, I did cannulate the  L4 and L5 pedicles bilaterally, using a medial to lateral cortical trajectory technique.  At this point, 6 x 30 mm screws were placed into the left pedicles, and a 40 mm rod was placed into the tulip heads of the screw, and caps were also placed.  Distraction was then applied across the L4-5 intervertebral space, and the caps were then provisionally tightened.  On the right side, bone wax was placed into the cannulated pedicle holes.  I then proceeded with the decompressive aspect of the procedure at the L4-5 level.  A partial facetectomy was performed bilaterally at L4-5, decompressing the L4-5 intervertebral space.  I was very pleased with the decompression. With an assistant holding medial retraction of the traversing right L5 nerve, I did perform an annulotomy at the posterolateral aspect of the L4-5 intervertebral space.  I then used a series of curettes and pituitary rongeurs to perform a thorough and complete intervertebral diskectomy.  The intervertebral space was then liberally packed with autograft as well as allograft in the form of Vivigen, as was the appropriate-sized intervertebral spacer.  The spacer was then tamped into position in the usual fashion, and expanded to 8.4 mm in height. I was very pleased with the press-fit of the spacer.  I then placed 6 mm screws on the right at L4 and L5. A 40-mm rod was then placed and caps were placed. The distraction was then released on the contralateral side.  All caps were then locked.  The wound was copiously irrigated  with a total of approximately 3 L prior to placing the bone graft.  I then turned my attention towards the posterolateral fusion on the left side.  A high-speed bur was used to decorticate the left L4 and L5 transverse processes, as well as the left L4-5 facet joint.  At this point, additional autograft and allograft was then packed into the posterolateral gutter on the left side to help aid in the success of the fusion.  The wound  was  explored for any undue bleeding and there was no substantial bleeding encountered.  Gel-Foam was placed over the laminectomy site.  The wound was then closed in layers using #1 Vicryl followed by 2-0 Vicryl, followed by 4-0 Monocryl.  Benzoin and Steri-Strips were applied followed by sterile dressing.     Of note, Ileana Clara was my assistant throughout surgery, and did aid in retraction, suctioning, the decompression, placement of the hardware, and closure.       Oneil Priestly, MD

## 2024-02-13 NOTE — Transfer of Care (Signed)
 Immediate Anesthesia Transfer of Care Note  Patient: Joyce Allen  Procedure(s) Performed: TRANSFORAMINAL LUMBAR INTERBODY FUSION (TLIF) WITH PEDICLE SCREW FIXATION 1 LEVEL (Right)  Patient Location: PACU  Anesthesia Type:General  Level of Consciousness: awake, alert , patient cooperative, and responds to stimulation  Airway & Oxygen Therapy: Patient Spontanous Breathing and Patient connected to face mask oxygen  Post-op Assessment: Report given to RN and Post -op Vital signs reviewed and stable  Post vital signs: Reviewed and stable  Last Vitals:  Vitals Value Taken Time  BP 114/63 02/13/24 12:30  Temp    Pulse 97 02/13/24 12:34  Resp 13 02/13/24 12:34  SpO2 92 % 02/13/24 12:34  Vitals shown include unfiled device data.  Last Pain:  Vitals:   02/13/24 0704  TempSrc:   PainSc: 0-No pain         Complications: No notable events documented.

## 2024-02-13 NOTE — H&P (Signed)
 PREOPERATIVE H&P  Chief Complaint: Right leg pain  HPI: Joyce Allen is a 68 y.o. female who presents with ongoing pain in the right leg  MRI reveals stenosis and instability at L4-5  Patient has failed multiple forms of conservative care and continues to have pain (see office notes for additional details regarding the patient's full course of treatment)  Past Medical History:  Diagnosis Date   Allergy    Arthritis    spine   History of hiatal hernia    Osteopenia    Seizures (HCC)    has not had a seizure in 42 years.   Sleep apnea    does not wear CPAP   Past Surgical History:  Procedure Laterality Date   APPENDECTOMY     BUNIONECTOMY Right    DILATION AND CURETTAGE OF UTERUS     TONSILLECTOMY AND ADENOIDECTOMY     TUBAL LIGATION     Social History   Socioeconomic History   Marital status: Married    Spouse name: Not on file   Number of children: 0   Years of education: Not on file   Highest education level: Not on file  Occupational History   Not on file  Tobacco Use   Smoking status: Never   Smokeless tobacco: Never  Vaping Use   Vaping status: Never Used  Substance and Sexual Activity   Alcohol use: Never   Drug use: Never   Sexual activity: Not on file  Other Topics Concern   Not on file  Social History Narrative   Not on file   Social Drivers of Health   Financial Resource Strain: Not on file  Food Insecurity: Low Risk  (08/20/2023)   Received from [facility]   Hunger Vital Sign    Within the past 12 months, you worried that your food would run out before you got money to buy more: Never true    Within the past 12 months, the food you bought just didn't last and you didn't have money to get more. : Never true  Transportation Needs: No Transportation Needs (08/20/2023)   Received from [facility]    In the past 12 months, has lack of reliable transportation kept you from medical appointments, meetings, work or  from getting things needed for daily living? : No  Physical Activity: Not on file  Stress: Not on file  Social Connections: Not on file   Family History  Problem Relation Age of Onset   Colon cancer Maternal Grandfather    Esophageal cancer Neg Hx    Liver cancer Neg Hx    Pancreatic cancer Neg Hx    Rectal cancer Neg Hx    Stomach cancer Neg Hx    Allergies  Allergen Reactions   Latex Swelling   Doxepin Rash   Prior to Admission medications   Medication Sig Start Date End Date Taking? Authorizing Provider  CALCIUM PO Take 2 tablets by mouth in the morning and at bedtime.   Yes [provider]  fluticasone  (FLONASE ) 50 MCG/ACT nasal spray Place 1 spray into both nostrils daily.   Yes [provider]  levocetirizine (XYZAL ) 5 MG tablet Take 5 mg by mouth every evening.   Yes [provider]  PHENobarbital  (LUMINAL) 97.2 MG tablet Take 97.2 mg by mouth at bedtime.   Yes [provider]     All other systems have been reviewed and were otherwise negative with the exception of  those mentioned in the HPI and as above.  Physical Exam: Vitals:   02/13/24 0635  BP: (!) (P) 150/90  Pulse: (P) 96  Resp: (P) 18  Temp: (P) 98 F (36.7 C)  SpO2: (P) 97%    Body mass index is 30.16 kg/m.  General: Alert, no acute distress Cardiovascular: No pedal edema Respiratory: No cyanosis, no use of accessory musculature Skin: No lesions in the area of chief complaint Neurologic: Sensation intact distally Psychiatric: Patient is competent for consent with normal mood and affect Lymphatic: No axillary or cervical lymphadenopathy   Assessment/Plan: SPINAL STENOSIS and instability, L4-5 Plan for Procedure(s): TRANSFORAMINAL LUMBAR INTERBODY FUSION (TLIF) WITH PEDICLE SCREW FIXATION 1 LEVEL   Oneil LITTIE Priestly, MD 02/13/2024 8:34 AM

## 2024-02-13 NOTE — Anesthesia Procedure Notes (Signed)
 Procedure Name: Intubation Date/Time: 02/13/2024 9:04 AM  Performed by: Jolynn Mage, CRNAPre-anesthesia Checklist: Patient identified, Patient being monitored, Timeout performed, Emergency Drugs available and Suction available Patient Re-evaluated:Patient Re-evaluated prior to induction Oxygen Delivery Method: Circle system utilized Preoxygenation: Pre-oxygenation with 100% oxygen Induction Type: IV induction Ventilation: Mask ventilation without difficulty and Oral airway inserted - appropriate to patient size Laryngoscope Size: Mac, 3 and Glidescope Grade View: Grade I Tube type: Oral Tube size: 7.0 mm Number of attempts: 2 Airway Equipment and Method: Video-laryngoscopy and Rigid stylet Placement Confirmation: ETT inserted through vocal cords under direct vision, positive ETCO2 and breath sounds checked- equal and bilateral Secured at: 21 cm Tube secured with: Tape Dental Injury: Teeth and Oropharynx as per pre-operative assessment  Difficulty Due To: Difficult Airway- due to anterior larynx Comments: Attempted with miller 2, vc barely visualized, unable to pass ett, glide mac 3, ett passed easily.

## 2024-02-14 DIAGNOSIS — M5116 Intervertebral disc disorders with radiculopathy, lumbar region: Secondary | ICD-10-CM | POA: Diagnosis not present

## 2024-02-14 MED ORDER — METHOCARBAMOL 500 MG PO TABS: 500.0000 mg | ORAL_TABLET | Freq: Four times a day (QID) | ORAL | 2 refills | Status: DC | PRN

## 2024-02-14 MED ORDER — OXYCODONE-ACETAMINOPHEN 5-325 MG PO TABS: 1.0000 | ORAL_TABLET | ORAL | 0 refills | Status: DC | PRN

## 2024-02-14 NOTE — Anesthesia Postprocedure Evaluation (Signed)
 Anesthesia Post Note  Patient: Joyce Allen  Procedure(s) Performed: TRANSFORAMINAL LUMBAR INTERBODY FUSION (TLIF) WITH PEDICLE SCREW FIXATION 1 LEVEL (Right)     Patient location during evaluation: PACU Anesthesia Type: General Level of consciousness: sedated and patient cooperative Pain management: pain level controlled Vital Signs Assessment: post-procedure vital signs reviewed and stable Respiratory status: spontaneous breathing Cardiovascular status: stable Anesthetic complications: no   No notable events documented.  Last Vitals:  Vitals:   02/14/24 0441 02/14/24 0725  BP: (!) 121/90 116/63  Pulse: 87 90  Resp: 18 18  Temp: 36.6 C 36.6 C  SpO2: 97% 96%    Last Pain:  Vitals:   02/14/24 0900  TempSrc:   PainSc: 0-No pain                 Norleen Pope

## 2024-02-14 NOTE — Progress Notes (Signed)
    Patient doing well  Patient denies leg pain   Physical Exam: Vitals:   02/14/24 0441 02/14/24 0725  BP: (!) 121/90 116/63  Pulse: 87 90  Resp: 18 18  Temp: 97.8 F (36.6 C) 97.8 F (36.6 C)  SpO2: 97% 96%   Patient looks excellent - eating breakfast Dressing in place NVI  POD #1 s/p L4/5 decompression and fusion, doing well  - up with PT/OT, encourage ambulation - Percocet for pain, Robaxin  for muscle spasms - d/c home today with f/u in 2 weeks

## 2024-02-14 NOTE — Evaluation (Signed)
 Physical Therapy Evaluation Patient Details Name: Joyce Allen MRN: [id number] DOB: 01/12/1956 Today's Date: 02/14/2024  History of Present Illness  Joyce Allen is a 68 y.o. female who presented 02/13/24 with L4-5 spinal stenosis, L4-5 spondylolisthesis, and right-sided lumbar radiculopathy. Pt s/p TLIF with one level pedicle screw fixation 8/6. PMHx: sleep apnea, seizures, hiatal hernia, and arthritis.   Clinical Impression  Pt admitted with above diagnosis. PTA, pt was independent to modI for functional mobility intermittently using SPC, modI for ADLs/IADLs, and driving. She lives with her husband in a one story house with a ramped entrance. Pt currently with functional limitations due to the deficits listed below (see PT Problem List). She required CGA for bed mobility, close supervision for transfers using RW, and close supervision for gait using RW. Educated pt on back precautions and provided her with handout. She required intermittent cues to adhere throughout mobility. Pt required assist to don TLSO brace. She ambulated ~121ft into the hallway without LOB. Pt will benefit from acute skilled PT to increase her independence and safety with mobility to allow discharge. Recommend HHPT to increase strength, improve balance, decrease fall risk, and optimize safety within the home environment.      If plan is discharge home, recommend the following: A little help with walking and/or transfers;A little help with bathing/dressing/bathroom;Assistance with cooking/housework;Assist for transportation;Help with stairs or ramp for entrance   Can travel by private vehicle        Equipment Recommendations None recommended by PT (Pt already has DME)  Recommendations for Other Services       Functional Status Assessment Patient has had a recent decline in their functional status and demonstrates the ability to make significant improvements in function in a reasonable and predictable amount of time.      Precautions / Restrictions Precautions Precautions: Back;Fall Precaution Booklet Issued: Yes (comment) Recall of Precautions/Restrictions: Impaired Precaution/Restrictions Comments: Educated pt on back precautions of no bending, lifting, or twisting. She recalled 2/3 when assessed at end of session. Required Braces or Orthoses: Spinal Brace Spinal Brace: Thoracolumbosacral orthotic;Applied in sitting position (May remove when in bed; May ambulate to bathroom without brace; May apply/remove while sitting or standing; May remove brace to shower.) Restrictions Weight Bearing Restrictions Per Provider Order: No      Mobility  Bed Mobility Overal bed mobility: Needs Assistance Bed Mobility: Rolling, Sidelying to Sit Rolling: Used rails, Contact guard assist Sidelying to sit: Used rails, Contact guard assist       General bed mobility comments: Educated pt on log roll technique. Cues for proper sequencing. Assist to roll into sidelying and elevate trunk. Pt scooted forward with BUE support.    Transfers Overall transfer level: Needs assistance Equipment used: Rolling walker (2 wheels) Transfers: Sit to/from Stand, Bed to chair/wheelchair/BSC Sit to Stand: Supervision   Step pivot transfers: Supervision       General transfer comment: Pt stood from lowest bed height. Cued proper hand placement using RW. Powered up without physical assist. Close supervision for safety. Good eccentric control.    Ambulation/Gait Ambulation/Gait assistance: Supervision Gait Distance (Feet): 100 Feet Assistive device: Rolling walker (2 wheels) Gait Pattern/deviations: Step-through pattern, Decreased stride length Gait velocity: reduced Gait velocity interpretation: 1.31 - 2.62 ft/sec, indicative of limited community ambulator   General Gait Details: Pt ambulated with a reciprocal gait pattern, even weight shift, and good foot clearence. She demonstrated upright posture and good proximity to  RW. No LOB.  Stairs  Wheelchair Mobility     Tilt Bed    Modified Rankin (Stroke Patients Only)       Balance Overall balance assessment: Mild deficits observed, not formally tested                                           Pertinent Vitals/Pain Pain Assessment Pain Assessment: 0-10 Pain Score: 4  Pain Location: Back Pain Descriptors / Indicators: Operative site guarding, Discomfort, Aching Pain Intervention(s): Monitored during session, Limited activity within patient's tolerance, Repositioned    Home Living Family/patient expects to be discharged to:: Private residence Living Arrangements: Spouse/significant other Available Help at Discharge: Family;Available 24 hours/day Type of Home: House Home Access: Ramped entrance       Home Layout: One level Home Equipment: Cane - single Librarian, academic (2 wheels)      Prior Function Prior Level of Function : Independent/Modified Independent;Driving             Mobility Comments: Indep to ModI. Pt reports intermittently using SPC depending on how bad her right knee was hurting. She states she has only been doing short distances lately. Denies fall history. ADLs Comments: ModI for ADLs/IADLs, reports it took increased time. Retired. Drives.     Extremity/Trunk Assessment   Upper Extremity Assessment Upper Extremity Assessment: Defer to OT evaluation    Lower Extremity Assessment Lower Extremity Assessment: Overall WFL for tasks assessed    Cervical / Trunk Assessment Cervical / Trunk Assessment: Back Surgery  Communication   Communication Communication: No apparent difficulties    Cognition Arousal: Alert Behavior During Therapy: WFL for tasks assessed/performed   PT - Cognitive impairments: No apparent impairments                       PT - Cognition Comments: Pt A,Ox4 Following commands: Intact       Cueing Cueing Techniques: Verbal cues, Gestural  cues, Visual cues     General Comments      Exercises     Assessment/Plan    PT Assessment Patient needs continued PT services  PT Problem List Decreased strength;Decreased range of motion;Decreased activity tolerance;Decreased balance;Decreased mobility;Decreased knowledge of precautions       PT Treatment Interventions DME instruction;Gait training;Functional mobility training;Therapeutic activities;Therapeutic exercise;Balance training;Patient/family education    PT Goals (Current goals can be found in the Care Plan section)  Acute Rehab PT Goals Patient Stated Goal: Return Home and get stronger PT Goal Formulation: With patient Time For Goal Achievement: 02/21/24 Potential to Achieve Goals: Good    Frequency Min 2X/week     Co-evaluation               AM-PAC PT 6 Clicks Mobility  Outcome Measure Help needed turning from your back to your side while in a flat bed without using bedrails?: A Little Help needed moving from lying on your back to sitting on the side of a flat bed without using bedrails?: A Little Help needed moving to and from a bed to a chair (including a wheelchair)?: A Little Help needed standing up from a chair using your arms (e.g., wheelchair or bedside chair)?: A Little Help needed to walk in hospital room?: A Little Help needed climbing 3-5 steps with a railing? : A Little 6 Click Score: 18    End of Session Equipment Utilized During Treatment: Back brace Activity  Tolerance: Patient tolerated treatment well Patient left: in chair;with call bell/phone within reach Nurse Communication: Mobility status;Precautions PT Visit Diagnosis: Difficulty in walking, not elsewhere classified (R26.2);Other abnormalities of gait and mobility (R26.89)    Time: 9187-9156 PT Time Calculation (min) (ACUTE ONLY): 31 min   Charges:   PT Evaluation $PT Eval Low Complexity: 1 Low PT Treatments $Gait Training: 8-22 mins PT General Charges $$ ACUTE PT  VISIT: 1 Visit         Randall SAUNDERS, PT, DPT Acute Rehabilitation Services Office: [phone] Secure Chat Preferred  Joyce Allen 02/14/2024, 9:14 AM

## 2024-02-14 NOTE — Evaluation (Signed)
 Occupational Therapy Evaluation Patient Details Name: Joyce Allen MRN: [id number] DOB: 1956-03-15 Today's Date: 02/14/2024   History of Present Illness   KYNNADI DICENSO is a 68 y.o. female who presented 02/13/24 with L4-5 spinal stenosis, L4-5 spondylolisthesis, and right-sided lumbar radiculopathy. Pt s/p TLIF with one level pedicle screw fixation 8/6. PMHx: sleep apnea, seizures, hiatal hernia, and arthritis.     Clinical Impressions At baseline. Pt is Mod I for ADLs and functional mobility with a SPC or RW. Pt receives assistance from husband for IADLs. Pt now presents with decreased activity tolerance, mildly decreased balance, decreased knowledge of precautions, decreased knowledge of AE/DME, and decreased safety and independence with functional tasks. Pt currently demonstrates ability to complete ADLs largely Ind to Mod assist, Max assist to doff/donn TLSO brace, and Supervision for functional mobility with a RW. Pt's husband demonstrates ability to Independently assist pt with ADLs at her current functional level and with donning/doffing brace. Plan for pt to discharge home with assist of husband and [facility] OT later this day. If pt does not discharge as planned, she will benefit from acute skilled OT.      If plan is discharge home, recommend the following:   A little help with walking and/or transfers;A little help with bathing/dressing/bathroom;Assistance with cooking/housework;Assist for transportation;Help with stairs or ramp for entrance     Functional Status Assessment   Patient has had a recent decline in their functional status and demonstrates the ability to make significant improvements in function in a reasonable and predictable amount of time.     Equipment Recommendations   None recommended by OT (Pt already has needed equipment.)     Recommendations for Other Services         Precautions/Restrictions   Precautions Precautions: Back;Fall Precaution  Booklet Issued: Yes (comment) (provided in prior PT session) Recall of Precautions/Restrictions: Impaired Precaution/Restrictions Comments: Re-educated pt on back precautions of no bending, lifting, or twisting. She recalled 2/3 when assessed at end of session. Required Braces or Orthoses: Spinal Brace Spinal Brace: Thoracolumbosacral orthotic;Applied in sitting position (May remove when in bed; May ambulate to bathroom without brace; May apply/remove while sitting or standing; May remove brace to shower.) Restrictions Other Position/Activity Restrictions: back precautions     Mobility Bed Mobility Overal bed mobility: Needs Assistance             General bed mobility comments: Pt sitting in recliner at beginning and end of session    Transfers Overall transfer level: Needs assistance Equipment used: Rolling walker (2 wheels) Transfers: Sit to/from Stand, Bed to chair/wheelchair/BSC Sit to Stand: Supervision     Step pivot transfers: Supervision            Balance Overall balance assessment: Mild deficits observed, not formally tested                                         ADL either performed or assessed with clinical judgement   ADL Overall ADL's : Needs assistance/impaired Eating/Feeding: Independent;Sitting   Grooming: Supervision/safety;Standing;With caregiver independent assisting   Upper Body Bathing: Contact guard assist;Set up;Sitting;With caregiver independent assisting (cues for back precautions)   Lower Body Bathing: Minimal assistance;Sit to/from stand;Sitting/lateral leans;With caregiver independent assisting;Cueing for compensatory techniques;Cueing for back precautions   Upper Body Dressing : Contact guard assist;Sitting;Cueing for compensatory techniques;With caregiver independent assisting (cues for back precautions) Upper Body Dressing Details (indicate  cue type and reason): Pt requiring Max assist to donn/doff TLSO brace with  husband Ind in assisting pt Lower Body Dressing: Minimal assistance;Moderate assistance;Sitting/lateral leans;Sit to/from stand;Cueing for compensatory techniques;With caregiver independent assisting;Cueing for back precautions;Adhering to back precautions   Toilet Transfer: Supervision/safety;With caregiver independent assisting;Ambulation;Rolling walker (2 wheels);Regular Toilet (adhering to back precautions)   Toileting- Clothing Manipulation and Hygiene: Set up;Supervision/safety;Sitting/lateral lean;Sit to/from stand;Cueing for compensatory techniques;Adhering to back precautions;Cueing for back precautions       Functional mobility during ADLs: Supervision/safety;Rolling walker (2 wheels);Caregiver able to provide necessary level of assistance General ADL Comments: Pt with decreased activity tolerance and occasional cues needed for adhering to back precautions and for compensatory techniques.     Vision Baseline Vision/History: 1 Wears glasses Ability to See in Adequate Light: 0 Adequate (with glasses) Patient Visual Report: No change from baseline       Perception         Praxis         Pertinent Vitals/Pain Pain Assessment Pain Assessment: 0-10 Pain Score: 3  Pain Location: Back Pain Descriptors / Indicators: Operative site guarding, Discomfort, Aching Pain Intervention(s): Limited activity within patient's tolerance, Monitored during session, Repositioned     Extremity/Trunk Assessment Upper Extremity Assessment Upper Extremity Assessment: Right hand dominant;Overall WFL for tasks assessed (gross B UE strength 4/5)   Lower Extremity Assessment Lower Extremity Assessment: Defer to PT evaluation   Cervical / Trunk Assessment Cervical / Trunk Assessment: Back Surgery   Communication Communication Communication: No apparent difficulties   Cognition Arousal: Alert Behavior During Therapy: WFL for tasks assessed/performed Cognition: No apparent impairments              OT - Cognition Comments: Pt AAOx4 and pleasant throughout session. Cognition WFL for tasks assessed.                 Following commands: Intact       Cueing  General Comments   Cueing Techniques: Verbal cues;Gestural cues;Visual cues  VSS. Pt's husband present and supportive throughout session.   Exercises     Shoulder Instructions      Home Living Family/patient expects to be discharged to:: Private residence Living Arrangements: Spouse/significant other Available Help at Discharge: Family;Available 24 hours/day Type of Home: House Home Access: Ramped entrance     Home Layout: One level     Bathroom Shower/Tub: Chief Strategy Officer: Handicapped height     Home Equipment: Cane - single Librarian, academic (2 wheels)          Prior Functioning/Environment Prior Level of Function : Independent/Modified Independent;Driving             Mobility Comments: Indep to ModI. Pt reports intermittently using SPC or RW depending on how bad her right knee was hurting. She states she has only been doing short distances lately. Denies fall history. ADLs Comments: ModI for ADLs/IADLs, reports it took increased time. Retired. Drives. Enjoys traveling and spending time with her husband.    OT Problem List: Decreased activity tolerance;Impaired balance (sitting and/or standing);Decreased knowledge of use of DME or AE;Decreased knowledge of precautions   OT Treatment/Interventions: Self-care/ADL training;Energy conservation;DME and/or AE instruction;Therapeutic activities;Patient/family education;Balance training      OT Goals(Current goals can be found in the care plan section)   Acute Rehab OT Goals Patient Stated Goal: to return home, heal well, and be able to travel with her husband OT Goal Formulation: With patient/family Time For Goal Achievement: 02/28/24 Potential to Achieve Goals:  Good ADL Goals Pt Will Perform Upper Body Bathing:  with supervision;with adaptive equipment;sitting (adhering to back precautions) Pt Will Perform Lower Body Bathing: with contact guard assist;with adaptive equipment;sitting/lateral leans;sit to/from stand (adhering to back precautions) Pt Will Perform Upper Body Dressing: with supervision;sitting (adhering to back precautions) Pt Will Perform Lower Body Dressing: with contact guard assist;with adaptive equipment;sitting/lateral leans;sit to/from stand (adhering to back precautions) Pt Will Transfer to Toilet: with modified independence;ambulating;regular height toilet (adhering to back precautions; with least restrictive AD) Pt Will Perform Toileting - Clothing Manipulation and hygiene: with modified independence;sit to/from stand;sitting/lateral leans (adhering to back precautions)   OT Frequency:  Min 2X/week    Co-evaluation              AM-PAC OT 6 Clicks Daily Activity     Outcome Measure Help from another person eating meals?: None Help from another person taking care of personal grooming?: A Little Help from another person toileting, which includes using toliet, bedpan, or urinal?: A Little Help from another person bathing (including washing, rinsing, drying)?: A Little Help from another person to put on and taking off regular upper body clothing?: A Little Help from another person to put on and taking off regular lower body clothing?: A Little 6 Click Score: 19   End of Session Equipment Utilized During Treatment: Rolling walker (2 wheels);Other (comment) (TLSO brace) Nurse Communication: Mobility status;Other (comment) (Pt ready to d/c from an OT standpoint)  Activity Tolerance: Patient tolerated treatment well Patient left: in chair;with call bell/phone within reach;with family/visitor present  OT Visit Diagnosis: Unsteadiness on feet (R26.81);Other abnormalities of gait and mobility (R26.89)                Time: 0941-1000 OT Time Calculation (min): 19 min Charges:   OT General Charges $OT Visit: 1 Visit OT Evaluation $OT Eval Low Complexity: 1 Low  Margarie Rockey HERO., OTR/L, MA Acute Rehab [phone]   Margarie FORBES Horns 02/14/2024, 2:08 PM

## 2024-02-14 NOTE — Plan of Care (Signed)
  Problem: Education: Goal: Ability to verbalize activity precautions or restrictions will improve Outcome: Progressing   Problem: Activity: Goal: Ability to avoid complications of mobility impairment will improve Outcome: Progressing   Problem: Bowel/Gastric: Goal: Gastrointestinal status for postoperative course will improve Outcome: Progressing   Problem: Clinical Measurements: Goal: Ability to maintain clinical measurements within normal limits will improve Outcome: Progressing   Problem: Pain Management: Goal: Pain level will decrease Outcome: Progressing   Problem: Skin Integrity: Goal: Will show signs of wound healing Outcome: Progressing   Problem: Health Behavior/Discharge Planning: Goal: Identification of resources available to assist in meeting health care needs will improve Outcome: Progressing   Problem: Bladder/Genitourinary: Goal: Urinary functional status for postoperative course will improve Outcome: Progressing

## 2024-02-14 NOTE — Progress Notes (Signed)
 Patient is discharged to home. DC instructions given with husband at bedside. Verbalized no concerns. Patient left unit in wheelchair pushed by female volunteer accompanied by husband. Left in stable condition. Patient and spouse encouraged to stop by [facility] and pick up drugs that were e-prescribed by provider . Both verbalized understanding.

## 2024-02-15 ENCOUNTER — Encounter (HOSPITAL_COMMUNITY): Payer: Self-pay | Admitting: Orthopedic Surgery

## 2024-02-21 NOTE — Discharge Summary (Signed)
 Patient ID: Joyce Allen MRN: [id number] DOB/AGE: Jul 20, 1955 68 y.o.  Admit date: 02/13/2024 Discharge date: 02/14/2024  Admission Diagnoses:  Principal Problem:   Radiculopathy due to lumbar intervertebral disc disorder   Discharge Diagnoses:  Same  Past Medical History:  Diagnosis Date   Allergy    Arthritis    spine   History of hiatal hernia    Osteopenia    Seizures (HCC)    has not had a seizure in 42 years.   Sleep apnea    does not wear CPAP    Surgeries: Procedure(s): TRANSFORAMINAL LUMBAR INTERBODY FUSION (TLIF) WITH PEDICLE SCREW FIXATION 1 LEVEL on 02/13/2024   Consultants: None  Discharged Condition: Improved  Hospital Course: Joyce Allen is an 68 y.o. female who was admitted 02/13/2024 for operative treatment of Radiculopathy due to lumbar intervertebral disc disorder. Patient has severe unremitting pain that affects sleep, daily activities, and work/hobbies. After pre-op clearance the patient was taken to the operating room on 02/13/2024 and underwent  Procedure(s): TRANSFORAMINAL LUMBAR INTERBODY FUSION (TLIF) WITH PEDICLE SCREW FIXATION 1 LEVEL.    Patient was given perioperative antibiotics:  Anti-infectives (From admission, onward)    Start     Dose/Rate Route Frequency Ordered Stop   02/13/24 1545  ceFAZolin  (ANCEF ) IVPB 2g/100 mL premix        2 g 200 mL/hr over 30 Minutes Intravenous Every 8 hours 02/13/24 1450 02/13/24 2356   02/13/24 0848  vancomycin  (VANCOCIN ) 1-5 GM/200ML-% IVPB       Note to Pharmacy: Joyce Allen: cabinet override      02/13/24 0848 02/13/24 2059   02/13/24 0630  ceFAZolin  (ANCEF ) IVPB 2g/100 mL premix  Status:  Discontinued        2 g 200 mL/hr over 30 Minutes Intravenous On call to O.R. 02/13/24 9370 02/13/24 1443        Patient was given sequential compression devices, early ambulation to prevent DVT.  Patient benefited maximally from hospital stay and there were no complications.    Recent vital  signs: BP 116/63 (BP Location: Left Arm)   Pulse 90   Temp 97.8 F (36.6 C)   Resp 18   Ht 5' 1 (1.549 m)   Wt 72.4 kg   SpO2 96%   BMI 30.16 kg/m    Discharge Medications:   Allergies as of 02/14/2024       Reactions   Latex Swelling   Doxepin Rash        Medication List     TAKE these medications    CALCIUM PO Take 2 tablets by mouth in the morning and at bedtime.   fluticasone  50 MCG/ACT nasal spray Commonly known as: FLONASE  Place 1 spray into both nostrils daily.   levocetirizine 5 MG tablet Commonly known as: XYZAL  Take 5 mg by mouth every evening.   methocarbamol  500 MG tablet Commonly known as: ROBAXIN  Take 1 tablet (500 mg total) by mouth every 6 (six) hours as needed for muscle spasms.   oxyCODONE -acetaminophen  5-325 MG tablet Commonly known as: PERCOCET/ROXICET Take 1-2 tablets by mouth every 4 (four) hours as needed for severe pain (pain score 7-10).   PHENobarbital  97.2 MG tablet Commonly known as: LUMINAL Take 97.2 mg by mouth at bedtime.        Diagnostic Studies: DG Lumbar Spine 2-3 Views Result Date: 02/13/2024 CLINICAL DATA:  Elective surgery. EXAM: LUMBAR SPINE - 2-3 VIEW COMPARISON:  Preoperative imaging FINDINGS: Two fluoroscopic spot views of the lumbar  spine submitted from the operating room. Posterior rod and pedicle screw fixation with interbody spacer at L4-L5. Fluoroscopy time 40.8 seconds. Dose 21.21 mGy. IMPRESSION: Intraoperative fluoroscopy during lumbar fusion. Electronically Signed   By: Joyce Allen M.D.   On: 02/13/2024 14:59   DG Lumbar Spine 1 View Result Date: 02/13/2024 CLINICAL DATA:  L4-5 fusion. EXAM: LUMBAR SPINE - 1 VIEW COMPARISON:  MRI of October 31, 2023. FINDINGS: Single intraoperative cross-table lateral projection of the lumbar spine was obtained. This demonstrates surgical probes directed toward the posterior spinous process of L2 as well as a nother probe at approximately the L5-S1 level. IMPRESSION: Surgical  localization as noted above. Electronically Signed   By: Joyce Allen M.D.   On: 02/13/2024 13:20   DG C-Arm 1-60 Min-No Report Result Date: 02/13/2024 Fluoroscopy was utilized by the requesting physician.  No radiographic interpretation.   DG C-Arm 1-60 Min-No Report Result Date: 02/13/2024 Fluoroscopy was utilized by the requesting physician.  No radiographic interpretation.   DG C-Arm 1-60 Min-No Report Result Date: 02/13/2024 Fluoroscopy was utilized by the requesting physician.  No radiographic interpretation.    Disposition: Discharge disposition: 01-Home or Self Care        POD #1 s/p L4/5 decompression and fusion, doing well   - up with PT/OT, encourage ambulation - Percocet for pain, Robaxin  for muscle spasms -Scripts for pain sent to pharmacy electronically  -D/C instructions sheet printed and in chart -D/C today  -F/U in office 2 weeks   Signed: Ileana PARAS Yuliana Allen 02/21/2024, 1:15 PM

## 2024-07-14 ENCOUNTER — Other Ambulatory Visit: Payer: Self-pay | Admitting: Orthopedic Surgery

## 2024-07-17 NOTE — Patient Instructions (Addendum)
 SURGICAL WAITING ROOM VISITATION Patients having surgery or a procedure may have no more than 2 support people in the waiting area - these visitors may rotate.    Children under the age of 61 will not be allowed to visit due to the increase in respiratory illness  Children under the age of 47 must have an adult with them who is not the patient.  If the patient needs to stay at the hospital during part of their recovery, the visitor guidelines for inpatient rooms apply. Pre-op nurse will coordinate an appropriate time for 1 support person to accompany patient in pre-op.  This support person may not rotate.    Please refer to the [facility] website for the visitor guidelines for Inpatients (after your surgery is over and you are in a regular room).       Your procedure is scheduled on: 08-04-24   Report to [facility] Main Entrance    Report to admitting at 5:15 AM   Call this number if you have problems the morning of surgery [phone]   Do not eat food :After Midnight.   After Midnight you may have the following liquids until 4:15 AM DAY OF SURGERY  Water Non-Citrus Juices (without pulp, NO RED-Apple, White grape, White cranberry) Black Coffee (NO MILK/CREAM OR CREAMERS, sugar ok)  Clear Tea (NO MILK/CREAM OR CREAMERS, sugar ok) regular and decaf                             Plain Jell-O (NO RED)                                           Fruit ices (not with fruit pulp, NO RED)                                     Popsicles (NO RED)                                                               Sports drinks like Gatorade (NO RED)                   The day of surgery:  Drink ONE (1) Pre-Surgery Clear Ensure by 4:15 AM the morning of surgery. Drink in one sitting. Do not sip.  This drink was given to you during your hospital  pre-op appointment visit. Nothing else to drink after completing the Pre-Surgery Clear Ensure.          If you have questions, please contact  your surgeons office.   FOLLOW  ANY ADDITIONAL PRE OP INSTRUCTIONS YOU RECEIVED FROM YOUR SURGEON'S OFFICE!!!     Oral Hygiene is also important to reduce your risk of infection.                                    Remember - BRUSH YOUR TEETH THE MORNING OF SURGERY WITH YOUR REGULAR TOOTHPASTE   Do NOT smoke after Midnight  Take these medicines the morning of surgery with A SIP OF WATER:    Okay to use Flonase  nasal spray   Stop all vitamins and herbal supplements 7 days before surgery                              You may not have any metal on your body including hair pins, jewelry, and body piercing             Do not wear make-up, lotions, powders, perfumes or deodorant  Do not wear nail polish including gel and S&S, artificial/acrylic nails, or any other type of covering on natural nails including finger and toenails. If you have artificial nails, gel coating, etc. that needs to be removed by a nail salon please have this removed prior to surgery or surgery may need to be canceled/ delayed if the surgeon/ anesthesia feels like they are unable to be safely monitored.   Do not shave  48 hours prior to surgery.          Do not bring valuables to the hospital. [facility] IS NOT RESPONSIBLE   FOR VALUABLES.   Contacts, dentures or bridgework may not be worn into surgery.   Bring small overnight bag day of surgery.   DO NOT BRING YOUR HOME MEDICATIONS TO THE HOSPITAL. PHARMACY WILL DISPENSE MEDICATIONS LISTED ON YOUR MEDICATION LIST TO YOU DURING YOUR ADMISSION IN THE HOSPITAL!              Please read over the following fact sheets you were given: IF YOU HAVE QUESTIONS ABOUT YOUR PRE-OP INSTRUCTIONS PLEASE CALL [phone] Gwen  If you received a COVID test during your pre-op visit  it is requested that you wear a mask when out in public, stay away from anyone that may not be feeling well and notify your surgeon if you develop symptoms. If you test positive for Covid or have been  in contact with anyone that has tested positive in the last 10 days please notify you surgeon.   Pre-operative 4 CHG Bath Instructions  DYNA-Hex 4 Chlorhexidine  Gluconate 4% Solution Antiseptic 4 fl. oz   You can play a key role in reducing the risk of infection after surgery. Your skin needs to be as free of germs as possible. You can reduce the number of germs on your skin by washing with CHG (chlorhexidine  gluconate) soap before surgery. CHG is an antiseptic soap that kills germs and continues to kill germs even after washing.   DO NOT use if you have an allergy to chlorhexidine /CHG or antibacterial soaps. If your skin becomes reddened or irritated, stop using the CHG and notify one of our RNs at   Please shower with the CHG soap starting 4 days before surgery using the following schedule:     Please keep in mind the following:  DO NOT shave, including legs and underarms, starting the day of your first shower.   You may shave your face at any point before/day of surgery.  Place clean sheets on your bed the day you start using CHG soap. Use a clean washcloth (not used since being washed) for each shower. DO NOT sleep with pets once you start using the CHG.  CHG Shower Instructions:  If you choose to wash your hair and private area, wash first with your normal shampoo/soap.  After you use shampoo/soap, rinse your hair and body thoroughly to remove shampoo/soap residue.  Turn the water OFF and apply about 3 tablespoons (45 ml) of CHG soap to a CLEAN washcloth.  Apply CHG soap ONLY FROM YOUR NECK DOWN TO YOUR TOES (washing for 3-5 minutes)  DO NOT use CHG soap on face, private areas, open wounds, or sores.  Pay special attention to the area where your surgery is being performed.  If you are having back surgery, having someone wash your back for you may be helpful. Wait 2 minutes after CHG soap is applied, then you may rinse off the CHG soap.  Pat dry with a clean towel  Put on clean  clothes/pajamas   If you choose to wear lotion, please use ONLY the CHG-compatible lotions on the back of this paper.     Additional instructions for the day of surgery:  Shower with regular soap the day of surgery DO NOT APPLY any lotions, deodorants, cologne, or perfumes.   Put on clean/comfortable clothes.  Brush your teeth.  Ask your nurse before applying any prescription medications to the skin.   CHG Compatible Lotions   Aveeno Moisturizing lotion  Cetaphil Moisturizing Cream  Cetaphil Moisturizing Lotion  Clairol Herbal Essence Moisturizing Lotion, Dry Skin  Clairol Herbal Essence Moisturizing Lotion, Extra Dry Skin  Clairol Herbal Essence Moisturizing Lotion, Normal Skin  Curel Age Defying Therapeutic Moisturizing Lotion with Alpha Hydroxy  Curel Extreme Care Body Lotion  Curel Soothing Hands Moisturizing Hand Lotion  Curel Therapeutic Moisturizing Cream, Fragrance-Free  Curel Therapeutic Moisturizing Lotion, Fragrance-Free  Curel Therapeutic Moisturizing Lotion, Original Formula  Eucerin Daily Replenishing Lotion  Eucerin Dry Skin Therapy Plus Alpha Hydroxy Crme  Eucerin Dry Skin Therapy Plus Alpha Hydroxy Lotion  Eucerin Original Crme  Eucerin Original Lotion  Eucerin Plus Crme Eucerin Plus Lotion  Eucerin TriLipid Replenishing Lotion  Keri Anti-Bacterial Hand Lotion  Keri Deep Conditioning Original Lotion Dry Skin Formula Softly Scented  Keri Deep Conditioning Original Lotion, Fragrance Free Sensitive Skin Formula  Keri Lotion Fast Absorbing Fragrance Free Sensitive Skin Formula  Keri Lotion Fast Absorbing Softly Scented Dry Skin Formula  Keri Original Lotion  Keri Skin Renewal Lotion Keri Silky Smooth Lotion  Keri Silky Smooth Sensitive Skin Lotion  Nivea Body Creamy Conditioning Oil  Nivea Body Extra Enriched Lotion  Nivea Body Original Lotion  Nivea Body Sheer Moisturizing Lotion Nivea Crme  Nivea Skin Firming Lotion  NutraDerm 30 Skin Lotion  NutraDerm  Skin Lotion  NutraDerm Therapeutic Skin Cream  NutraDerm Therapeutic Skin Lotion  ProShield Protective Hand Cream  Provon moisturizing lotion   PATIENT SIGNATURE_________________________________  NURSE SIGNATURE__________________________________  ________________________________________________________________________    Joyce Allen  An incentive spirometer is a tool that can help keep your lungs clear and active. This tool measures how well you are filling your lungs with each breath. Taking long deep breaths may help reverse or decrease the chance of developing breathing (pulmonary) problems (especially infection) following: A long period of time when you are unable to move or be active. BEFORE THE PROCEDURE  If the spirometer includes an indicator to show your best effort, your nurse or respiratory therapist will set it to a desired goal. If possible, sit up straight or lean slightly forward. Try not to slouch. Hold the incentive spirometer in an upright position. INSTRUCTIONS FOR USE  Sit on the edge of your bed if possible, or sit up as far as you can in bed or on a chair. Hold the incentive spirometer in an upright position. Breathe out normally. Place the mouthpiece in your mouth  and seal your lips tightly around it. Breathe in slowly and as deeply as possible, raising the piston or the ball toward the top of the column. Hold your breath for 3-5 seconds or for as long as possible. Allow the piston or ball to fall to the bottom of the column. Remove the mouthpiece from your mouth and breathe out normally. Rest for a few seconds and repeat Steps 1 through 7 at least 10 times every 1-2 hours when you are awake. Take your time and take a few normal breaths between deep breaths. The spirometer may include an indicator to show your best effort. Use the indicator as a goal to work toward during each repetition. After each set of 10 deep breaths, practice coughing to be sure  your lungs are clear. If you have an incision (the cut made at the time of surgery), support your incision when coughing by placing a pillow or rolled up towels firmly against it. Once you are able to get out of bed, walk around indoors and cough well. You may stop using the incentive spirometer when instructed by your caregiver.  RISKS AND COMPLICATIONS Take your time so you do not get dizzy or light-headed. If you are in pain, you may need to take or ask for pain medication before doing incentive spirometry. It is harder to take a deep breath if you are having pain. AFTER USE Rest and breathe slowly and easily. It can be helpful to keep track of a log of your progress. Your caregiver can provide you with a simple table to help with this. If you are using the spirometer at home, follow these instructions: SEEK MEDICAL CARE IF:  You are having difficultly using the spirometer. You have trouble using the spirometer as often as instructed. Your pain medication is not giving enough relief while using the spirometer. You develop fever of 100.5 F (38.1 C) or higher. SEEK IMMEDIATE MEDICAL CARE IF:  You cough up bloody sputum that had not been present before. You develop fever of 102 F (38.9 C) or greater. You develop worsening pain at or near the incision site. MAKE SURE YOU:  Understand these instructions. Will watch your condition. Will get help right away if you are not doing well or get worse. Document Released: 11/06/2006 Document Revised: 09/18/2011 Document Reviewed: 01/07/2007 ExitCare Patient Information 2014 ExitCare, [place].   ________________________________________________________________________ WHAT IS A BLOOD TRANSFUSION? Blood Transfusion Information  A transfusion is the replacement of blood or some of its parts. Blood is made up of multiple cells which provide different functions. Red blood cells carry oxygen and are used for blood loss replacement. White blood cells  fight against infection. Platelets control bleeding. Plasma helps clot blood. Other blood products are available for specialized needs, such as hemophilia or other clotting disorders. BEFORE THE TRANSFUSION  Who gives blood for transfusions?  Healthy volunteers who are fully evaluated to make sure their blood is safe. This is blood bank blood. Transfusion therapy is the safest it has ever been in the practice of medicine. Before blood is taken from a donor, a complete history is taken to make sure that person has no history of diseases nor engages in risky social behavior (examples are intravenous drug use or sexual activity with multiple partners). The donor's travel history is screened to minimize risk of transmitting infections, such as malaria. The donated blood is tested for signs of infectious diseases, such as HIV and hepatitis. The blood is then tested to be sure  it is compatible with you in order to minimize the chance of a transfusion reaction. If you or a relative donates blood, this is often done in anticipation of surgery and is not appropriate for emergency situations. It takes many days to process the donated blood. RISKS AND COMPLICATIONS Although transfusion therapy is very safe and saves many lives, the main dangers of transfusion include:  Getting an infectious disease. Developing a transfusion reaction. This is an allergic reaction to something in the blood you were given. Every precaution is taken to prevent this. The decision to have a blood transfusion has been considered carefully by your caregiver before blood is given. Blood is not given unless the benefits outweigh the risks. AFTER THE TRANSFUSION Right after receiving a blood transfusion, you will usually feel much better and more energetic. This is especially true if your red blood cells have gotten low (anemic). The transfusion raises the level of the red blood cells which carry oxygen, and this usually causes an energy  increase. The nurse administering the transfusion will monitor you carefully for complications. HOME CARE INSTRUCTIONS  No special instructions are needed after a transfusion. You may find your energy is better. Speak with your caregiver about any limitations on activity for underlying diseases you may have. SEEK MEDICAL CARE IF:  Your condition is not improving after your transfusion. You develop redness or irritation at the intravenous (IV) site. SEEK IMMEDIATE MEDICAL CARE IF:  Any of the following symptoms occur over the next 12 hours: Shaking chills. You have a temperature by mouth above 102 F (38.9 C), not controlled by medicine. Chest, back, or muscle pain. People around you feel you are not acting correctly or are confused. Shortness of breath or difficulty breathing. Dizziness and fainting. You get a rash or develop hives. You have a decrease in urine output. Your urine turns a dark color or changes to pink, red, or brown. Any of the following symptoms occur over the next 10 days: You have a temperature by mouth above 102 F (38.9 C), not controlled by medicine. Shortness of breath. Weakness after normal activity. The white part of the eye turns yellow (jaundice). You have a decrease in the amount of urine or are urinating less often. Your urine turns a dark color or changes to pink, red, or brown. Document Released: 06/23/2000 Document Revised: 09/18/2011 Document Reviewed: 02/10/2008 [facility] Patient Information 2014 [place], [place].  _______________________________________________________________________

## 2024-07-17 NOTE — Progress Notes (Signed)
 Date of COVID positive in last 90 days:  PCP - Cathlyn Nash, MD/Melissa Sharlot, NP Cardiologist -   Chest x-ray - N/A EKG 06-04-24 [facility] Stress Test - N/A ECHO - N/A Cardiac Cath - N/A Pacemaker/ICD device last checked:N/A Spinal Cord Stimulator:N/A  Bowel Prep - N/A  Sleep Study - Yes, +sleep apnea CPAP -   Fasting Blood Sugar - N/A Checks Blood Sugar _____ times a day  Last dose of GLP1 agonist-  N/A GLP1 instructions:  Do not take after     Last dose of SGLT-2 inhibitors-  N/A SGLT-2 instructions:  Do not take after     Blood Thinner Instructions: N/A Last dose:   Time: Aspirin Instructions:N/A Last Dose:  Activity level:  Can go up a flight of stairs and perform activities of daily living without stopping and without symptoms of chest pain or shortness of breath.  Able to exercise without symptoms  Unable to go up a flight of stairs without symptoms of     Anesthesia review: Seizures  Patient denies shortness of breath, fever, cough and chest pain at PAT appointment  Patient verbalized understanding of instructions that were given to them at the PAT appointment. Patient was also instructed that they will need to review over the PAT instructions again at home before surgery.

## 2024-07-18 NOTE — Progress Notes (Signed)
 Sent message, via epic in basket, requesting orders in epic from Careers adviser.

## 2024-07-22 ENCOUNTER — Encounter (HOSPITAL_COMMUNITY): Payer: Self-pay

## 2024-07-22 ENCOUNTER — Ambulatory Visit (HOSPITAL_COMMUNITY)
Admission: RE | Admit: 2024-07-22 | Discharge: 2024-07-22 | Disposition: A | Discharge status: Home / Self Care | Source: Ambulatory Visit | Attending: Orthopedic Surgery | Admitting: Orthopedic Surgery

## 2024-07-22 ENCOUNTER — Other Ambulatory Visit: Payer: Self-pay

## 2024-07-22 ENCOUNTER — Encounter (HOSPITAL_COMMUNITY)
Admission: RE | Admit: 2024-07-22 | Discharge: 2024-07-22 | Disposition: A | Source: Ambulatory Visit | Attending: Orthopedic Surgery

## 2024-07-22 VITALS — BP 141/68 | HR 83 | Temp 97.7°F | Resp 16 | Ht 61.0 in | Wt 169.0 lb

## 2024-07-22 DIAGNOSIS — Z01818 Encounter for other preprocedural examination: Secondary | ICD-10-CM | POA: Insufficient documentation

## 2024-07-22 HISTORY — DX: Headache, unspecified: R51.9

## 2024-07-22 LAB — SURGICAL PCR SCREEN
MRSA, PCR: NEGATIVE
Staphylococcus aureus: POSITIVE — AB

## 2024-07-22 LAB — TYPE AND SCREEN
ABO/RH(D): A NEG
Antibody Screen: NEGATIVE

## 2024-07-23 NOTE — Progress Notes (Signed)
PCR results sent to Dr. Luiz Blare to review. ?

## 2024-07-31 DIAGNOSIS — M1611 Unilateral primary osteoarthritis, right hip: Secondary | ICD-10-CM | POA: Diagnosis present

## 2024-07-31 NOTE — H&P (Signed)
 TOTAL HIP ADMISSION H&P  Patient is admitted for right total hip arthroplasty.  Subjective:  Chief Complaint: right hip pain  HPI: Joyce Allen, 69 y.o. female, has a history of pain and functional disability in the right hip(s) due to arthritis and patient has failed non-surgical conservative treatments for greater than 12 weeks to include NSAID's and/or analgesics, flexibility and strengthening excercises, use of assistive devices, and activity modification.  Onset of symptoms was gradual starting 1 years ago with gradually worsening course since that time.The patient noted no past surgery on the right hip(s).  Patient currently rates pain in the right hip at 10 out of 10 with activity. Patient has night pain, worsening of pain with activity and weight bearing, trendelenberg gait, pain that interfers with activities of daily living, and pain with passive range of motion. Patient has evidence of joint space narrowing by imaging studies. This condition presents safety issues increasing the risk of falls.   There is no current active infection.  Patient Active Problem List   Diagnosis Date Noted   Osteoarthritis of right hip 07/31/2024   Radiculopathy due to lumbar intervertebral disc disorder 02/13/2024   Lumbar degenerative disc disease 10/09/2019   Chronic osteoarthritis 03/10/2019   Malaise and fatigue 10/18/2016   Epilepsy (HCC) 09/27/2015   High risk medication use 09/27/2015   Mixed hyperlipidemia 09/27/2015   Osteopenia of multiple sites 09/27/2015   Primary insomnia 09/27/2015   Sleep apnea 09/27/2015   Past Medical History:  Diagnosis Date   Allergy    Arthritis    spine   Headache    Sinus   History of hiatal hernia    Osteopenia    Seizures (HCC)    has not had a seizure in 42 years.   Sleep apnea    does not wear CPAP    Past Surgical History:  Procedure Laterality Date   APPENDECTOMY     BUNIONECTOMY Right    COLONOSCOPY     DILATION AND CURETTAGE OF UTERUS      TONSILLECTOMY AND ADENOIDECTOMY     TRANSFORAMINAL LUMBAR INTERBODY FUSION (TLIF) WITH PEDICLE SCREW FIXATION 1 LEVEL Right 02/13/2024   Procedure: TRANSFORAMINAL LUMBAR INTERBODY FUSION (TLIF) WITH PEDICLE SCREW FIXATION 1 LEVEL;  Surgeon: Beuford Anes, MD;  Location: [facility] OR;  Service: Orthopedics;  Laterality: Right;  RIGHT-SIDED LUMBAR 4- LUMBAR 5 TRANSFORAMINAL LUMBAR INTERBODY FUSION AND DECOMPRESSION WITH INSTRUMENTATION AND ALLOGRAFT   TUBAL LIGATION      No current facility-administered medications for this encounter.   Current Outpatient Medications  Medication Sig Dispense Refill Last Dose/Taking   CALCIUM PO Take 2 tablets by mouth in the morning and at bedtime.   Taking   fluticasone  (FLONASE ) 50 MCG/ACT nasal spray Place 1 spray into both nostrils daily.   Taking   levocetirizine (XYZAL ) 5 MG tablet Take 5 mg by mouth every evening.   Taking   PHENobarbital  (LUMINAL) 97.2 MG tablet Take 97.2 mg by mouth at bedtime.   Taking   triamcinolone cream (KENALOG) 0.1 % Apply 1 Application topically daily as needed (itching).   Taking As Needed   methocarbamol  (ROBAXIN ) 500 MG tablet Take 1 tablet (500 mg total) by mouth every 6 (six) hours as needed for muscle spasms. (Patient not taking: Reported on 07/17/2024) 30 tablet 2 Not Taking   oxyCODONE -acetaminophen  (PERCOCET/ROXICET) 5-325 MG tablet Take 1-2 tablets by mouth every 4 (four) hours as needed for severe pain (pain score 7-10). (Patient not taking: Reported on 07/17/2024) 30 tablet  0 Not Taking   Allergies[1]  Social History   Tobacco Use   Smoking status: Never   Smokeless tobacco: Never  Substance Use Topics   Alcohol use: Never    Family History  Problem Relation Age of Onset   Colon cancer Maternal Grandfather    Esophageal cancer Neg Hx    Liver cancer Neg Hx    Pancreatic cancer Neg Hx    Rectal cancer Neg Hx    Stomach cancer Neg Hx      Review of Systems  Constitutional: Negative.   HENT: Negative.     Eyes: Negative.   Respiratory:  Positive for apnea.        Sleep apnea  Cardiovascular: Negative.   Gastrointestinal: Negative.   Endocrine: Negative.   Genitourinary: Negative.   Musculoskeletal:  Positive for arthralgias and back pain.  Skin: Negative.   Allergic/Immunologic: Negative.   Neurological:        Epilepsy  Hematological: Negative.   Psychiatric/Behavioral:  Positive for sleep disturbance.     Objective:  Physical Exam Constitutional:      Appearance: Normal appearance. She is normal weight.  HENT:     Head: Normocephalic and atraumatic.     Nose: Nose normal.  Eyes:     Pupils: Pupils are equal, round, and reactive to light.  Cardiovascular:     Pulses: Normal pulses.  Pulmonary:     Effort: Pulmonary effort is normal.  Musculoskeletal:     Cervical back: Normal range of motion.     Comments: Patient has minimal tenderness at the right greater trochanter, she is very limited with hip flexion and has almost immediate pain with flexion, as well as pain with internal and external rotation. She also has tenderness with knee extension and flexion and crepitus palpated throughout knee motion. Leg length discrepancy noted.  Skin:    General: Skin is warm and dry.  Neurological:     General: No focal deficit present.     Mental Status: She is alert and oriented to person, place, and time. Mental status is at baseline.  Psychiatric:        Mood and Affect: Mood normal.        Behavior: Behavior normal.        Thought Content: Thought content normal.        Judgment: Judgment normal.     Vital signs in last 24 hours:    Labs:   Estimated body mass index is 31.93 kg/m as calculated from the following:   Height as of 07/22/24: 5' 1 (1.549 m).   Weight as of 07/22/24: 76.7 kg.   Imaging Review Plain radiographs demonstrate that the Right hip has severe DJD with near complete degeneration of her acetabulum.   Assessment/Plan:  End stage arthritis,  right hip(s)  The patient history, physical examination, clinical judgement of the provider and imaging studies are consistent with end stage degenerative joint disease of the right hip(s) and total hip arthroplasty is deemed medically necessary. The treatment options including medical management, injection therapy, arthroscopy and arthroplasty were discussed at length. The risks and benefits of total hip arthroplasty were presented and reviewed. The risks due to aseptic loosening, infection, stiffness, dislocation/subluxation,  thromboembolic complications and other imponderables were discussed.  The patient acknowledged the explanation, agreed to proceed with the plan and consent was signed. Patient is being admitted for inpatient treatment for surgery, pain control, PT, OT, prophylactic antibiotics, VTE prophylaxis, progressive ambulation and ADL's and discharge planning.The  patient is planning to be discharged home with home health services    Patient's anticipated LOS is less than 2 midnights, meeting these requirements: - Younger than 87 - Lives within 1 hour of care - Has a competent adult at home to recover with post-op recover - NO history of  - Chronic pain requiring opiods  - Diabetes  - Coronary Artery Disease  - Heart failure  - Heart attack  - Stroke  - DVT/VTE  - Cardiac arrhythmia  - Respiratory Failure/COPD  - Renal failure  - Anemia  - Advanced Liver disease      [1]  Allergies Allergen Reactions   Latex Swelling   Doxepin Rash

## 2024-08-02 NOTE — Anesthesia Preprocedure Evaluation (Signed)
"                                    Anesthesia Evaluation  Patient identified by MRN, date of birth, ID band Patient awake    Reviewed: Allergy & Precautions, NPO status , Patient's Chart, lab work & pertinent test results  Airway Mallampati: III  TM Distance: >3 FB Neck ROM: Full    Dental no notable dental hx.    Pulmonary sleep apnea    Pulmonary exam normal        Cardiovascular negative cardio ROS Normal cardiovascular exam     Neuro/Psych  Headaches, Seizures -,   Neuromuscular disease  negative psych ROS   GI/Hepatic Neg liver ROS, hiatal hernia,,,  Endo/Other  negative endocrine ROS    Renal/GU negative Renal ROS     Musculoskeletal  (+) Arthritis ,  TRANSFORAMINAL LUMBAR INTERBODY FUSION (TLIF) WITH PEDICLE SCREW FIXATION 1 LEVEL 02/13/2024   Abdominal  (+) + obese  Peds  Hematology negative hematology ROS (+)   Anesthesia Other Findings RIGHT HIP DEGENERATIVE JOINT DISEASE  Reproductive/Obstetrics                              Anesthesia Physical Anesthesia Plan  ASA: 2  Anesthesia Plan: General   Post-op Pain Management:    Induction: Intravenous  PONV Risk Score and Plan: 3 and Ondansetron , Dexamethasone , Midazolam  and Treatment may vary due to age or medical condition  Airway Management Planned: Oral ETT  Additional Equipment:   Intra-op Plan:   Post-operative Plan: Extubation in OR  Informed Consent: I have reviewed the patients History and Physical, chart, labs and discussed the procedure including the risks, benefits and alternatives for the proposed anesthesia with the patient or authorized representative who has indicated his/her understanding and acceptance.     Dental advisory given  Plan Discussed with: CRNA  Anesthesia Plan Comments:          Anesthesia Quick Evaluation  "

## 2024-08-04 ENCOUNTER — Encounter (HOSPITAL_COMMUNITY): Payer: Self-pay | Admitting: Anesthesiology

## 2024-08-04 ENCOUNTER — Ambulatory Visit (HOSPITAL_COMMUNITY)
Admission: RE | Admit: 2024-08-04 | Discharge: 2024-08-04 | Disposition: A | Discharge status: Home / Self Care | Source: Ambulatory Visit | Attending: Orthopedic Surgery | Admitting: Orthopedic Surgery

## 2024-08-04 ENCOUNTER — Other Ambulatory Visit: Payer: Self-pay

## 2024-08-04 ENCOUNTER — Encounter (HOSPITAL_COMMUNITY): Payer: Self-pay | Admitting: Orthopedic Surgery

## 2024-08-04 ENCOUNTER — Ambulatory Visit (HOSPITAL_COMMUNITY)

## 2024-08-04 ENCOUNTER — Encounter (HOSPITAL_COMMUNITY)
Admission: RE | Disposition: A | Discharge status: Home / Self Care | Payer: Self-pay | Source: Ambulatory Visit | Attending: Orthopedic Surgery

## 2024-08-04 DIAGNOSIS — Z6831 Body mass index (BMI) 31.0-31.9, adult: Secondary | ICD-10-CM | POA: Diagnosis not present

## 2024-08-04 DIAGNOSIS — K449 Diaphragmatic hernia without obstruction or gangrene: Secondary | ICD-10-CM | POA: Insufficient documentation

## 2024-08-04 DIAGNOSIS — M1611 Unilateral primary osteoarthritis, right hip: Secondary | ICD-10-CM | POA: Insufficient documentation

## 2024-08-04 DIAGNOSIS — M8589 Other specified disorders of bone density and structure, multiple sites: Secondary | ICD-10-CM | POA: Diagnosis not present

## 2024-08-04 DIAGNOSIS — R5381 Other malaise: Secondary | ICD-10-CM | POA: Insufficient documentation

## 2024-08-04 DIAGNOSIS — G40909 Epilepsy, unspecified, not intractable, without status epilepticus: Secondary | ICD-10-CM | POA: Insufficient documentation

## 2024-08-04 DIAGNOSIS — E669 Obesity, unspecified: Secondary | ICD-10-CM | POA: Insufficient documentation

## 2024-08-04 DIAGNOSIS — R519 Headache, unspecified: Secondary | ICD-10-CM | POA: Insufficient documentation

## 2024-08-04 DIAGNOSIS — M5116 Intervertebral disc disorders with radiculopathy, lumbar region: Secondary | ICD-10-CM | POA: Diagnosis not present

## 2024-08-04 DIAGNOSIS — Z79899 Other long term (current) drug therapy: Secondary | ICD-10-CM | POA: Diagnosis not present

## 2024-08-04 DIAGNOSIS — G4730 Sleep apnea, unspecified: Secondary | ICD-10-CM | POA: Insufficient documentation

## 2024-08-04 DIAGNOSIS — E782 Mixed hyperlipidemia: Secondary | ICD-10-CM | POA: Diagnosis not present

## 2024-08-04 DIAGNOSIS — F5101 Primary insomnia: Secondary | ICD-10-CM | POA: Insufficient documentation

## 2024-08-04 MED ORDER — POVIDONE-IODINE 10 % EX SWAB: 2.0000 | Freq: Once | CUTANEOUS | Status: DC

## 2024-08-04 MED ORDER — ORAL CARE MOUTH RINSE: 15.0000 mL | Freq: Once | OROMUCOSAL | Status: AC

## 2024-08-04 MED ORDER — OXYCODONE HCL 5 MG PO TABS
ORAL_TABLET | ORAL | Status: AC
  Filled 2024-08-04: qty 1

## 2024-08-04 MED ORDER — METHOCARBAMOL 500 MG PO TABS
ORAL_TABLET | ORAL | Status: AC
  Filled 2024-08-04: qty 1

## 2024-08-04 MED ORDER — BUPIVACAINE LIPOSOME 1.3 % IJ SUSP: 10.0000 mL | Freq: Once | INTRAMUSCULAR | Status: DC

## 2024-08-04 MED ORDER — ONDANSETRON HCL 4 MG/2ML IJ SOLN
4.0000 mg | Freq: Once | INTRAMUSCULAR | Status: AC | PRN
  Administered 2024-08-04: 4 mg via INTRAVENOUS

## 2024-08-04 MED ORDER — CHLORHEXIDINE GLUCONATE 4 % EX SOLN: CUTANEOUS | 1 refills | Status: AC

## 2024-08-04 MED ORDER — LIDOCAINE 2% (20 MG/ML) 5 ML SYRINGE
INTRAMUSCULAR | Status: DC | PRN
  Administered 2024-08-04: 60 mg via INTRAVENOUS

## 2024-08-04 MED ORDER — OXYCODONE HCL 5 MG PO TABS: 10.0000 mg | ORAL_TABLET | ORAL | Status: DC | PRN

## 2024-08-04 MED ORDER — CEFAZOLIN SODIUM-DEXTROSE 2-4 GM/100ML-% IV SOLN
2.0000 g | INTRAVENOUS | Status: AC
  Administered 2024-08-04: 2 g via INTRAVENOUS
  Filled 2024-08-04: qty 100

## 2024-08-04 MED ORDER — TRANEXAMIC ACID-NACL 1000-0.7 MG/100ML-% IV SOLN
INTRAVENOUS | Status: AC
  Filled 2024-08-04: qty 100

## 2024-08-04 MED ORDER — ACETAMINOPHEN 500 MG PO TABS
1000.0000 mg | ORAL_TABLET | Freq: Once | ORAL | Status: AC
  Administered 2024-08-04: 1000 mg via ORAL
  Filled 2024-08-04: qty 2

## 2024-08-04 MED ORDER — FENTANYL CITRATE (PF) 100 MCG/2ML IJ SOLN
INTRAMUSCULAR | Status: AC
  Filled 2024-08-04: qty 2

## 2024-08-04 MED ORDER — HYDROMORPHONE HCL 1 MG/ML IJ SOLN
INTRAMUSCULAR | Status: AC
  Filled 2024-08-04: qty 1

## 2024-08-04 MED ORDER — ONDANSETRON HCL 4 MG/2ML IJ SOLN: 4.0000 mg | Freq: Four times a day (QID) | INTRAMUSCULAR | Status: DC | PRN

## 2024-08-04 MED ORDER — PHENYLEPHRINE HCL (PRESSORS) 10 MG/ML IV SOLN
INTRAVENOUS | Status: DC | PRN
  Administered 2024-08-04 (×2): 80 ug via INTRAVENOUS

## 2024-08-04 MED ORDER — AMISULPRIDE (ANTIEMETIC) 5 MG/2ML IV SOLN
10.0000 mg | Freq: Once | INTRAVENOUS | Status: AC | PRN
  Administered 2024-08-04: 10 mg via INTRAVENOUS

## 2024-08-04 MED ORDER — ONDANSETRON HCL 4 MG/2ML IJ SOLN
INTRAMUSCULAR | Status: AC
  Filled 2024-08-04: qty 2

## 2024-08-04 MED ORDER — BUPIVACAINE LIPOSOME 1.3 % IJ SUSP
INTRAMUSCULAR | Status: AC
  Filled 2024-08-04: qty 20

## 2024-08-04 MED ORDER — TRANEXAMIC ACID-NACL 1000-0.7 MG/100ML-% IV SOLN
1000.0000 mg | INTRAVENOUS | Status: DC
  Filled 2024-08-04: qty 100

## 2024-08-04 MED ORDER — METOCLOPRAMIDE HCL 5 MG PO TABS: 5.0000 mg | ORAL_TABLET | Freq: Three times a day (TID) | ORAL | Status: DC | PRN

## 2024-08-04 MED ORDER — AMISULPRIDE (ANTIEMETIC) 5 MG/2ML IV SOLN
INTRAVENOUS | Status: AC
  Filled 2024-08-04: qty 4

## 2024-08-04 MED ORDER — MUPIROCIN 2 % EX OINT: 1.0000 | TOPICAL_OINTMENT | Freq: Two times a day (BID) | CUTANEOUS | 0 refills | Status: AC

## 2024-08-04 MED ORDER — METHOCARBAMOL 500 MG PO TABS
500.0000 mg | ORAL_TABLET | Freq: Four times a day (QID) | ORAL | Status: DC | PRN
  Administered 2024-08-04: 500 mg via ORAL

## 2024-08-04 MED ORDER — METHOCARBAMOL 1000 MG/10ML IJ SOLN: 500.0000 mg | Freq: Four times a day (QID) | INTRAMUSCULAR | Status: DC | PRN

## 2024-08-04 MED ORDER — ONDANSETRON HCL 4 MG/2ML IJ SOLN
INTRAMUSCULAR | Status: DC | PRN
  Administered 2024-08-04: 4 mg via INTRAVENOUS

## 2024-08-04 MED ORDER — HYDROMORPHONE HCL 1 MG/ML IJ SOLN: 0.5000 mg | INTRAMUSCULAR | Status: DC | PRN

## 2024-08-04 MED ORDER — SODIUM CHLORIDE (PF) 0.9 % IJ SOLN
INTRAMUSCULAR | Status: AC
  Filled 2024-08-04: qty 30

## 2024-08-04 MED ORDER — SUGAMMADEX SODIUM 200 MG/2ML IV SOLN
INTRAVENOUS | Status: DC | PRN
  Administered 2024-08-04: 150 mg via INTRAVENOUS

## 2024-08-04 MED ORDER — BUPIVACAINE-EPINEPHRINE (PF) 0.25% -1:200000 IJ SOLN
INTRAMUSCULAR | Status: AC
  Filled 2024-08-04: qty 30

## 2024-08-04 MED ORDER — LACTATED RINGERS IV SOLN: INTRAVENOUS | Status: DC

## 2024-08-04 MED ORDER — BUPIVACAINE-EPINEPHRINE (PF) 0.25% -1:200000 IJ SOLN
INTRAMUSCULAR | Status: DC | PRN
  Administered 2024-08-04: 50 mL

## 2024-08-04 MED ORDER — METOCLOPRAMIDE HCL 5 MG/ML IJ SOLN
INTRAMUSCULAR | Status: AC
  Filled 2024-08-04: qty 2

## 2024-08-04 MED ORDER — FENTANYL CITRATE (PF) 50 MCG/ML IJ SOSY
PREFILLED_SYRINGE | INTRAMUSCULAR | Status: AC
  Filled 2024-08-04: qty 2

## 2024-08-04 MED ORDER — LACTATED RINGERS IV BOLUS
250.0000 mL | Freq: Once | INTRAVENOUS | Status: AC
  Administered 2024-08-04: 250 mL via INTRAVENOUS

## 2024-08-04 MED ORDER — DEXAMETHASONE SOD PHOSPHATE PF 10 MG/ML IJ SOLN
INTRAMUSCULAR | Status: DC | PRN
  Administered 2024-08-04: 10 mg via INTRAVENOUS

## 2024-08-04 MED ORDER — PROPOFOL 10 MG/ML IV BOLUS
INTRAVENOUS | Status: DC | PRN
  Administered 2024-08-04: 50 mg via INTRAVENOUS
  Administered 2024-08-04: 100 mg via INTRAVENOUS
  Administered 2024-08-04: 50 mg via INTRAVENOUS

## 2024-08-04 MED ORDER — ONDANSETRON HCL 4 MG PO TABS: 4.0000 mg | ORAL_TABLET | Freq: Four times a day (QID) | ORAL | Status: DC | PRN

## 2024-08-04 MED ORDER — FENTANYL CITRATE (PF) 50 MCG/ML IJ SOSY
25.0000 ug | PREFILLED_SYRINGE | INTRAMUSCULAR | Status: DC | PRN
  Administered 2024-08-04 (×3): 50 ug via INTRAVENOUS

## 2024-08-04 MED ORDER — LACTATED RINGERS IV BOLUS
500.0000 mL | Freq: Once | INTRAVENOUS | Status: AC
  Administered 2024-08-04: 500 mL via INTRAVENOUS

## 2024-08-04 MED ORDER — OXYCODONE HCL 5 MG PO TABS
5.0000 mg | ORAL_TABLET | ORAL | Status: DC | PRN
  Administered 2024-08-04 (×2): 5 mg via ORAL

## 2024-08-04 MED ORDER — WATER FOR IRRIGATION, STERILE IR SOLN
Status: DC | PRN
  Administered 2024-08-04: 2000 mL

## 2024-08-04 MED ORDER — FENTANYL CITRATE (PF) 100 MCG/2ML IJ SOLN
INTRAMUSCULAR | Status: DC | PRN
  Administered 2024-08-04: 100 ug via INTRAVENOUS
  Administered 2024-08-04: 50 ug via INTRAVENOUS

## 2024-08-04 MED ORDER — TIZANIDINE HCL 2 MG PO TABS: 2.0000 mg | ORAL_TABLET | Freq: Four times a day (QID) | ORAL | 0 refills | Status: AC | PRN

## 2024-08-04 MED ORDER — PROPOFOL 10 MG/ML IV BOLUS
INTRAVENOUS | Status: AC
  Filled 2024-08-04: qty 20

## 2024-08-04 MED ORDER — TRANEXAMIC ACID-NACL 1000-0.7 MG/100ML-% IV SOLN
1000.0000 mg | Freq: Once | INTRAVENOUS | Status: AC
  Administered 2024-08-04: 1000 mg via INTRAVENOUS

## 2024-08-04 MED ORDER — ASPIRIN 325 MG PO TBEC: 325.0000 mg | DELAYED_RELEASE_TABLET | Freq: Two times a day (BID) | ORAL | 0 refills | Status: AC

## 2024-08-04 MED ORDER — BUPIVACAINE LIPOSOME 1.3 % IJ SUSP
INTRAMUSCULAR | Status: AC
  Filled 2024-08-04: qty 10

## 2024-08-04 MED ORDER — CHLORHEXIDINE GLUCONATE 0.12 % MT SOLN
15.0000 mL | Freq: Once | OROMUCOSAL | Status: AC
  Administered 2024-08-04: 15 mL via OROMUCOSAL

## 2024-08-04 MED ORDER — DOCUSATE SODIUM 100 MG PO CAPS: 100.0000 mg | ORAL_CAPSULE | Freq: Two times a day (BID) | ORAL | 2 refills | Status: AC

## 2024-08-04 MED ORDER — FENTANYL CITRATE (PF) 50 MCG/ML IJ SOSY
PREFILLED_SYRINGE | INTRAMUSCULAR | Status: AC
  Filled 2024-08-04: qty 1

## 2024-08-04 MED ORDER — METOCLOPRAMIDE HCL 5 MG/ML IJ SOLN
5.0000 mg | Freq: Three times a day (TID) | INTRAMUSCULAR | Status: DC | PRN
  Administered 2024-08-04: 10 mg via INTRAVENOUS

## 2024-08-04 MED ORDER — OXYCODONE-ACETAMINOPHEN 5-325 MG PO TABS: 1.0000 | ORAL_TABLET | ORAL | 0 refills | Status: AC | PRN

## 2024-08-04 MED ORDER — ROCURONIUM BROMIDE 10 MG/ML (PF) SYRINGE
PREFILLED_SYRINGE | INTRAVENOUS | Status: DC | PRN
  Administered 2024-08-04: 60 mg via INTRAVENOUS
  Administered 2024-08-04: 20 mg via INTRAVENOUS

## 2024-08-04 NOTE — Op Note (Addendum)
 PATIENT ID:      Joyce Allen  MRN:     [id number] DOB/AGE:    [birth year] / 69 y.o.       OPERATIVE REPORT    DATE OF PROCEDURE:  08/04/2024       PREOPERATIVE DIAGNOSIS:  RIGHT HIP DEGENERATIVE JOINT DISEASE                                                       Estimated body mass index is 31.93 kg/m as calculated from the following:   Height as of this encounter: 5' 1 (1.549 m).   Weight as of this encounter: 76.7 kg.     POSTOPERATIVE DIAGNOSIS:  RIGHT HIP DEGENERATIVE JOINT DISEASE                                                           PROCEDURE:  1. right total hip arthroplasty using a 52 mm DePuy gription Cup, [employer],  neutral liner, a + 5 36 mm ceramic head,  and a #4  Actisstem, 2.interpretation of multiple intraoperative fluoroscopic images   SURGEON: Norleen LITTIE Gavel    ASSISTANT:   Camellia Miu  (present throughout entire procedure and necessary for timely completion of the procedure)  ANESTHESIA: general  BLOOD LOSS: 500cc Tranexamic Acid : 1 gram IV DRAINS: None COMPLICATIONS: None    NDICATIONS FOR PROCEDURE:Patient with end-stage arthritis of the right hip.  X-rays show bone-on-bone arthritic changes. Despite conservative measures with observation, anti-inflammatory medicine, narcotics, use of a cane, has severe unremitting pain and can ambulate only less than 1 block before resting.  Patient desires elective right total hip arthroplasty to decrease pain and increase function. The risks, benefits, and alternatives were discussed at length including but not limited to the risks of infection, bleeding, nerve injury, stiffness, blood clots, the need for revision surgery, cardiopulmonary complications, among others, and they were willing to proceed.Benefits have been discussed. Questions answered.     PROCEDURE IN DETAIL: The patient was identified by armband,  received preoperative IV antibiotics in the holding area at [facility], taken to  the operating room , appropriate anesthetic monitors  were attached and spinal anesthesia was induced.  The patient was placed onto the hot bed and all bony prominences were well-padded.The right hip was prepped and draped for an anterior approach to the hip.  An incision was made and the subcutaneous dissection was down to the level of the tensor fascia.  The fascia was opened and finger dissected.  The bleeders coming across the anterior portion of the hip were identified and cauterized. Retractors were put in place above and below the femoral neck.  The capsule was opened and tagged and a provisional neck cut was made.  The head was removed and sized on the back table.  The acetabulum was sequentially reamed to a level of 51 mm and a 52 mm gription coated pinnacle cup was hammered into place with 45 of lateral opening and 30 of anteversion.fluoroscopy was used to ensure this position of the cup.  Attention was turned towards the femur where the leg was actually  rotated, extended, and adduction did.  The femur was sequentially broached until a size of 4broach gave a perfect fit and fill.at this point a  1.5 mm delta ceramic hip ball was placed and the hip reduced.  Fluoroscopic images were taken to assess the leg length, fit and fill of the stem, and cup position.  We were happy with the construct at this point.  The 4 broach was removed and a final Actis stem with standard offset  and a +5 36mm ceramic hip ball was placed and reduced.  Final images were taken to make certain there were happy with the position at this point.   The capsule was closed with #1 Vicryl suture.  The tensor fascia was closed with 0 Vicryl suture.  The skin was then closed with combination of 0 and 2-0 Vicryl suture.  The top layer was with 3-0 Monocryl suture.  Benzoin and Steri-Strips were applied  and a sterile compressive dressing was applied and the patient taken to recovery room she noted be in satisfactory condition.  Past  medical Motion for the procedure was approximately 500 cc.  Of note Camellia Ellen was present the entire case and assisted by retraction of tissues, manipulation of the leg, and closing the minimize or time.   Norleen LITTIE Gavel 08/04/2024, 10:01 AM

## 2024-08-04 NOTE — Evaluation (Signed)
 Physical Therapy Evaluation Patient Details Name: Joyce Allen MRN: [id number] DOB: 14-Mar-1956 Today's Date: 08/04/2024  History of Present Illness  69 yo female s/p R THA, AA on 08/04/24. PMH: DDD, sleep apnea, OA, epilepsy, HH  Clinical Impression  Patient evaluated by Physical Therapy with no further acute PT needs identified. All education has been completed and the patient has no further questions.  Mobility reviewed as below. THA exercise program implemented, handout reviewed.  Pt spouse supportive, present for session. All questions encouraged and answered. Pt is ready to d/c from PT standpoint with spouse assisting as needed   See below for any follow-up Physical Therapy or equipment needs. PT is signing off. Thank you for this referral.         If plan is discharge home, recommend the following: A little help with walking and/or transfers;A little help with bathing/dressing/bathroom;Help with stairs or ramp for entrance;Assist for transportation;Assistance with cooking/housework   Can travel by private vehicle        Equipment Recommendations None recommended by PT  Recommendations for Other Services       Functional Status Assessment Patient has had a recent decline in their functional status and demonstrates the ability to make significant improvements in function in a reasonable and predictable amount of time.     Precautions / Restrictions Precautions Precautions: Fall Restrictions Weight Bearing Restrictions Per Provider Order: No Other Position/Activity Restrictions: WBAT      Mobility  Bed Mobility Overal bed mobility: Needs Assistance Bed Mobility: Supine to Sit, Sit to Supine     Supine to sit: Min assist, Contact guard Sit to supine: Min assist, Contact guard assist   General bed mobility comments: educated on use of gait belt to use as leg lifter. pt spouse able to assist as needed-reviewed and pt husbverbalizes technique. assist to progress RLE off  bed, pt able to utilize gait belt to lift RLE on to bed for return to supine    Transfers Overall transfer level: Needs assistance Equipment used: Rolling walker (2 wheels) Transfers: Sit to/from Stand Sit to Stand: Contact guard assist, Supervision           General transfer comment: cues for hand placement and RLE Position. supervision/CGA for safety. STS from stretcher and toilet    Ambulation/Gait Ambulation/Gait assistance: Contact guard assist, Supervision Gait Distance (Feet): 90 Feet (x2) Assistive device: Rolling walker (2 wheels) Gait Pattern/deviations: Step-to pattern, Step-through pattern, Decreased step length - right, Decreased step length - left Gait velocity: decr but functional     General Gait Details: cues for RW position and safety. no LOB. wt shift to RLE improved with distance  Stairs            Wheelchair Mobility     Tilt Bed    Modified Rankin (Stroke Patients Only)       Balance Overall balance assessment: Needs assistance Sitting-balance support: Feet supported, No upper extremity supported Sitting balance-Leahy Scale: Good     Standing balance support: Reliant on assistive device for balance, During functional activity Standing balance-Leahy Scale: Fair Standing balance comment: able to stand at sink, wash hands, no LOB                             Pertinent Vitals/Pain Pain Assessment Pain Assessment: 0-10 Pain Score: 5  Pain Location: right hip Pain Descriptors / Indicators: Aching, Sore Pain Intervention(s): Limited activity within patient's tolerance, Monitored during session, Premedicated  before session, Repositioned, Ice applied    Home Living Family/patient expects to be discharged to:: Private residence Living Arrangements: Spouse/significant other Available Help at Discharge: Family;Available 24 hours/day Type of Home: House Home Access: Ramped entrance       Home Layout: One level Home Equipment:  Cane - single Librarian, Academic (2 wheels)      Prior Function Prior Level of Function : Independent/Modified Independent;Driving             Mobility Comments: Indep to ModI. Pt reports intermittently using SPC or RW depending on how bad her right knee was hurting. She states she has only been doing short distances lately. Denies fall history. ADLs Comments: ModI for ADLs/IADLs, reports it took increased time. Retired. Drives. Enjoys traveling and spending time with her husband.     Extremity/Trunk Assessment   Upper Extremity Assessment Upper Extremity Assessment: Overall WFL for tasks assessed    Lower Extremity Assessment Lower Extremity Assessment: RLE deficits/detail RLE Deficits / Details: ankle WFL,  knee extension 3/5, knee flexion 3/5, hip grossly 2+/5,  anticipated post op deficits       Communication   Communication Communication: No apparent difficulties    Cognition Arousal: Alert Behavior During Therapy: WFL for tasks assessed/performed   PT - Cognitive impairments: No apparent impairments                         Following commands: Intact       Cueing Cueing Techniques: Verbal cues     General Comments      Exercises Total Joint Exercises Ankle Circles/Pumps: AROM, Both, 10 reps Quad Sets: 10 reps, Both, AROM Heel Slides: AAROM, Right, 10 reps Hip ABduction/ADduction: AAROM, Right, 10 reps   Assessment/Plan    PT Assessment All further PT needs can be met in the next venue of care  PT Problem List         PT Treatment Interventions      PT Goals (Current goals can be found in the Care Plan section)  Acute Rehab PT Goals PT Goal Formulation: All assessment and education complete, DC therapy    Frequency       Co-evaluation               AM-PAC PT 6 Clicks Mobility  Outcome Measure Help needed turning from your back to your side while in a flat bed without using bedrails?: A Little Help needed moving from  lying on your back to sitting on the side of a flat bed without using bedrails?: A Little Help needed moving to and from a bed to a chair (including a wheelchair)?: A Little Help needed standing up from a chair using your arms (e.g., wheelchair or bedside chair)?: A Little Help needed to walk in hospital room?: A Little Help needed climbing 3-5 steps with a railing? : A Little 6 Click Score: 18    End of Session Equipment Utilized During Treatment: Gait belt Activity Tolerance: Patient tolerated treatment well Patient left: in bed;with call bell/phone within reach;with family/visitor present Nurse Communication: Mobility status PT Visit Diagnosis: Other abnormalities of gait and mobility (R26.89)    Time: 8592-8567 PT Time Calculation (min) (ACUTE ONLY): 25 min   Charges:   PT Evaluation $PT Eval Low Complexity: 1 Low PT Treatments $Gait Training: 8-22 mins PT General Charges $$ ACUTE PT VISIT: 1 Visit         Rexene, PT  Acute Rehab Dept ([facility]) [phone]  08/04/2024   [facility] 08/04/2024, 2:46 PM

## 2024-08-04 NOTE — Transfer of Care (Signed)
 Immediate Anesthesia Transfer of Care Note  Patient: Joyce Allen  Procedure(s) Performed: ARTHROPLASTY, HIP, TOTAL, ANTERIOR APPROACH (Right: Hip)  Patient Location: PACU  Anesthesia Type:General  Level of Consciousness: sedated, patient cooperative, and responds to stimulation  Airway & Oxygen Therapy: Patient Spontanous Breathing and Patient connected to face mask oxygen  Post-op Assessment: Report given to RN and Post -op Vital signs reviewed and stable  Post vital signs: Reviewed and stable  Last Vitals:  Vitals Value Taken Time  BP    Temp    Pulse 87 08/04/24 09:29  Resp 16 08/04/24 09:29  SpO2 99 % 08/04/24 09:29  Vitals shown include unfiled device data.  Last Pain:  Vitals:   08/04/24 0613  TempSrc:   PainSc: 0-No pain         Complications: No notable events documented.

## 2024-08-04 NOTE — Discharge Instructions (Signed)

## 2024-08-04 NOTE — Anesthesia Postprocedure Evaluation (Signed)
"   Anesthesia Post Note  Patient: Joyce Allen  Procedure(s) Performed: ARTHROPLASTY, HIP, TOTAL, ANTERIOR APPROACH (Right: Hip)     Patient location during evaluation: PACU Anesthesia Type: General Level of consciousness: awake Pain management: pain level controlled Vital Signs Assessment: post-procedure vital signs reviewed and stable Respiratory status: spontaneous breathing, nonlabored ventilation and respiratory function stable Cardiovascular status: blood pressure returned to baseline and stable Postop Assessment: no apparent nausea or vomiting Anesthetic complications: no   No notable events documented.  Last Vitals:  Vitals:   08/04/24 1300 08/04/24 1330  BP: 117/77 116/70  Pulse: 72 77  Resp:    Temp:    SpO2: 95% 94%    Last Pain:  Vitals:   08/04/24 1330  TempSrc:   PainSc: 4                  Marget Outten P Amariss Detamore      "

## 2024-08-04 NOTE — Anesthesia Procedure Notes (Signed)
 Procedure Name: Intubation Date/Time: 08/04/2024 7:40 AM  Performed by: Cindie Charleen PARAS, CRNAPre-anesthesia Checklist: Emergency Drugs available, Patient identified, Suction available, Patient being monitored and Timeout performed Patient Re-evaluated:Patient Re-evaluated prior to induction Oxygen Delivery Method: Circle system utilized Preoxygenation: Pre-oxygenation with 100% oxygen Induction Type: IV induction Ventilation: Mask ventilation without difficulty Laryngoscope Size: Glidescope and 3 Grade View: Grade I Tube type: Oral Tube size: 7.0 mm Number of attempts: 2 Airway Equipment and Method: Stylet and Video-laryngoscopy Placement Confirmation: ETT inserted through vocal cords under direct vision, positive ETCO2 and breath sounds checked- equal and bilateral Secured at: 21 cm Tube secured with: Tape Dental Injury: Teeth and Oropharynx as per pre-operative assessment

## 2024-08-04 NOTE — Interval H&P Note (Signed)
 History and Physical Interval Note:  08/04/2024 7:07 AM  Joyce Allen  has presented today for surgery, with the diagnosis of RIGHT HIP DEGENERATIVE JOINT DISEASE.  The various methods of treatment have been discussed with the patient and family. After consideration of risks, benefits and other options for treatment, the patient has consented to  Procedures: ARTHROPLASTY, HIP, TOTAL, ANTERIOR APPROACH (Right) as a surgical intervention.  The patient's history has been reviewed, patient examined, no change in status, stable for surgery.  I have reviewed the patient's chart and labs.  Questions were answered to the patient's satisfaction.     Norleen LITTIE Gavel

## 2024-08-05 ENCOUNTER — Encounter (HOSPITAL_COMMUNITY): Payer: Self-pay | Admitting: Orthopedic Surgery
